# Patient Record
Sex: Male | Born: 1975 | Race: White | Hispanic: No | Marital: Married | Smoking: Former smoker
Health system: Southern US, Community
[De-identification: ages and names within clinical notes are randomized; demographics above are authoritative.]

## PROBLEM LIST (undated history)

## (undated) DIAGNOSIS — M779 Enthesopathy, unspecified: Secondary | ICD-10-CM

## (undated) DIAGNOSIS — E669 Obesity, unspecified: Secondary | ICD-10-CM

## (undated) DIAGNOSIS — E119 Type 2 diabetes mellitus without complications: Secondary | ICD-10-CM

## (undated) DIAGNOSIS — R7989 Other specified abnormal findings of blood chemistry: Secondary | ICD-10-CM

## (undated) DIAGNOSIS — M1990 Unspecified osteoarthritis, unspecified site: Secondary | ICD-10-CM

## (undated) DIAGNOSIS — I10 Essential (primary) hypertension: Secondary | ICD-10-CM

## (undated) DIAGNOSIS — K219 Gastro-esophageal reflux disease without esophagitis: Secondary | ICD-10-CM

## (undated) DIAGNOSIS — G4730 Sleep apnea, unspecified: Secondary | ICD-10-CM

## (undated) DIAGNOSIS — K760 Fatty (change of) liver, not elsewhere classified: Secondary | ICD-10-CM

## (undated) DIAGNOSIS — J45909 Unspecified asthma, uncomplicated: Secondary | ICD-10-CM

## (undated) HISTORY — PX: TONSILLECTOMY: SUR1361

## (undated) HISTORY — DX: Type 2 diabetes mellitus without complications: E11.9

## (undated) HISTORY — DX: Unspecified osteoarthritis, unspecified site: M19.90

## (undated) HISTORY — DX: Obesity, unspecified: E66.9

## (undated) HISTORY — DX: Fatty (change of) liver, not elsewhere classified: K76.0

## (undated) HISTORY — PX: WISDOM TOOTH EXTRACTION: SHX21

## (undated) HISTORY — DX: Unspecified asthma, uncomplicated: J45.909

## (undated) HISTORY — DX: Gastro-esophageal reflux disease without esophagitis: K21.9

## (undated) HISTORY — DX: Other specified abnormal findings of blood chemistry: R79.89

## (undated) HISTORY — PX: CARPAL TUNNEL RELEASE: SHX101

## (undated) HISTORY — DX: Essential (primary) hypertension: I10

## (undated) HISTORY — DX: Sleep apnea, unspecified: G47.30

---

## 2001-06-08 ENCOUNTER — Emergency Department (HOSPITAL_COMMUNITY): Admission: EM | Admit: 2001-06-08 | Discharge: 2001-06-08 | Payer: Self-pay | Admitting: *Deleted

## 2002-05-03 ENCOUNTER — Encounter: Payer: Self-pay | Admitting: Emergency Medicine

## 2002-05-03 ENCOUNTER — Emergency Department (HOSPITAL_COMMUNITY): Admission: EM | Admit: 2002-05-03 | Discharge: 2002-05-03 | Payer: Self-pay | Admitting: Emergency Medicine

## 2003-04-06 ENCOUNTER — Ambulatory Visit (HOSPITAL_COMMUNITY): Admission: RE | Admit: 2003-04-06 | Discharge: 2003-04-06 | Payer: Self-pay | Admitting: *Deleted

## 2006-03-18 ENCOUNTER — Encounter: Admission: RE | Admit: 2006-03-18 | Discharge: 2006-03-18 | Payer: Self-pay | Admitting: Orthopaedic Surgery

## 2006-03-26 ENCOUNTER — Ambulatory Visit (HOSPITAL_COMMUNITY): Admission: RE | Admit: 2006-03-26 | Discharge: 2006-03-26 | Payer: Self-pay | Admitting: Family Medicine

## 2011-04-01 ENCOUNTER — Ambulatory Visit (HOSPITAL_COMMUNITY)
Admission: RE | Admit: 2011-04-01 | Discharge: 2011-04-01 | Disposition: A | Discharge status: Home / Self Care | Payer: BC Managed Care – PPO | Source: Ambulatory Visit | Attending: Family Medicine | Admitting: Family Medicine

## 2011-04-01 ENCOUNTER — Other Ambulatory Visit (HOSPITAL_COMMUNITY): Payer: Self-pay | Admitting: Family Medicine

## 2011-04-01 DIAGNOSIS — R0989 Other specified symptoms and signs involving the circulatory and respiratory systems: Secondary | ICD-10-CM

## 2011-04-01 DIAGNOSIS — R0602 Shortness of breath: Secondary | ICD-10-CM | POA: Insufficient documentation

## 2011-04-01 DIAGNOSIS — R0609 Other forms of dyspnea: Secondary | ICD-10-CM

## 2015-12-31 ENCOUNTER — Ambulatory Visit (INDEPENDENT_AMBULATORY_CARE_PROVIDER_SITE_OTHER): Payer: BC Managed Care – PPO

## 2015-12-31 ENCOUNTER — Other Ambulatory Visit: Payer: Self-pay | Admitting: Neurosurgery

## 2015-12-31 DIAGNOSIS — M4302 Spondylolysis, cervical region: Secondary | ICD-10-CM

## 2015-12-31 DIAGNOSIS — M542 Cervicalgia: Secondary | ICD-10-CM

## 2016-01-10 ENCOUNTER — Other Ambulatory Visit: Payer: Self-pay | Admitting: Neurosurgery

## 2016-01-10 DIAGNOSIS — Z77018 Contact with and (suspected) exposure to other hazardous metals: Secondary | ICD-10-CM

## 2016-01-10 DIAGNOSIS — M542 Cervicalgia: Secondary | ICD-10-CM

## 2016-01-16 ENCOUNTER — Ambulatory Visit
Admission: RE | Admit: 2016-01-16 | Discharge: 2016-01-16 | Disposition: A | Discharge status: Home / Self Care | Payer: BC Managed Care – PPO | Source: Ambulatory Visit | Attending: Neurosurgery | Admitting: Neurosurgery

## 2016-01-16 DIAGNOSIS — Z77018 Contact with and (suspected) exposure to other hazardous metals: Secondary | ICD-10-CM

## 2016-01-21 ENCOUNTER — Ambulatory Visit
Admission: RE | Admit: 2016-01-21 | Discharge: 2016-01-21 | Disposition: A | Discharge status: Home / Self Care | Payer: BC Managed Care – PPO | Source: Ambulatory Visit | Attending: Neurosurgery | Admitting: Neurosurgery

## 2016-01-21 DIAGNOSIS — M542 Cervicalgia: Secondary | ICD-10-CM

## 2016-02-03 ENCOUNTER — Ambulatory Visit (HOSPITAL_COMMUNITY): Payer: BC Managed Care – PPO | Attending: Neurosurgery

## 2016-02-03 DIAGNOSIS — G540 Brachial plexus disorders: Secondary | ICD-10-CM

## 2016-02-03 DIAGNOSIS — M542 Cervicalgia: Secondary | ICD-10-CM | POA: Diagnosis not present

## 2016-02-03 DIAGNOSIS — M62838 Other muscle spasm: Secondary | ICD-10-CM | POA: Diagnosis present

## 2016-02-03 NOTE — Therapy (Signed)
[place], [place], [address] Phone: [phone]   Fax:  [phone]  Physical Therapy Evaluation  Patient Details  Name: Samuel Campbell MRN: [id number] Date of Birth: [birth year] Referring Provider: Ashok Pall   Encounter Date: 02/03/2016      PT End of Session - 02/03/16 1438    Visit Number 1   Number of Visits 12   Date for PT Re-Evaluation 03/04/16   Authorization Type [place] Time Period 02/03/16-04/04/16   PT Start Time 1302   PT Stop Time 1402   PT Time Calculation (min) 60 min   Activity Tolerance Patient tolerated treatment well;Patient limited by pain   Behavior During Therapy [facility] for tasks assessed/performed      No past medical history on file.  No past surgical history on file.  There were no vitals filed for this visit.       Subjective Assessment - 02/03/16 1303    Subjective Pt reports continued pain in neck/arm since fall down steps. His lower back pain has resolved well after course of prednisone, etc.    Pertinent History Sustained a fall onto his buttocks down ~8 steps back in early september 2017. Immediately experienced acute onset of acute neck pain, and exacerbation of chornic back pain. Pt reports episode of neck pain with mobility deficits 5Ya that responded well/quickly to cortisone shot/medical management.    How long can you sit comfortably? Not consistently limited   How long can you stand comfortably? Limited to 45-60 minutes during periods of exacerbation.    Diagnostic tests MRI: no significant findings per patient.    Patient Stated Goals resolve pain, improve tolerance to function.    Currently in Pain? 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 1   Has the patient had a decrease in activity level because of a fear of falling?  Yes   Is the patient reluctant to leave their home because of a fear of falling?  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, inection bilat;     Posture/Postural Control   Posture/Postural Control Postural limitations   Postural  Limitations Rounded Shoulders;Flexed trunk;Increased thoracic kyphosis  +cervical lordosis, subbocciptal/pec minor tightness     ROM / Strength   AROM / PROM / Strength AROM;Strength     AROM   AROM Assessment Site Cervical  all assessed to gravity, seated due to postural limits   Cervical Flexion 45   Cervical Extension 31   Cervical - Right Side Bend 36   Cervical - Left Side Bend 37   Cervical - Right Rotation 58   Cervical - Left Rotation 65     Strength   Strength Assessment Site Hand;Elbow;Shoulder   Right/Left Shoulder Right;Left   Right Shoulder Flexion 4/5  serratus 4+/5   Right Shoulder Extension 5/5  prone, shoulder 90* abduction   Right  Shoulder ABduction 5/5   Right Shoulder Internal Rotation 5/5   Right Shoulder External Rotation 4+/5   Right Shoulder Horizontal ABduction 4-/5  (middle Trap); Scapular elevation: 5/5   Right Shoulder Horizontal ADduction --  Posterior deltoid: 5/5   Left Shoulder Flexion 4/5  serratus 4+/5   Left Shoulder Extension 4+/5  prone, shoulder 90* abduction   Left Shoulder ABduction 4+/5   Left Shoulder Internal Rotation 4+/5   Left Shoulder External Rotation 4/5   Left Shoulder Horizontal ABduction --  4-/5 (middle Trap); Scapular elevation: 5/5   Left Shoulder Horizontal ADduction --  Posterior deltoid: 4+/5   Right/Left Elbow Right;Left   Right Elbow Flexion 5/5   Right Elbow Extension 4+/5   Left Elbow Flexion 4+/5   Left Elbow Extension 4/5   Right/Left hand Right;Left   Right Hand Grip (lbs) 115   Left Hand Grip (lbs) 120     Palpation   Spinal mobility Rigid thoracic kyphosis    Palpation comment Painful upper trap, subboccipitals, 1st rib, scalenes Bilat   Painful Left Pec Minor.      Special Tests    Special Tests Cervical   Cervical Tests Spurling's     Spurling's   Findings Negative   Side Left   Comment A and B test bilat, both negative                   [facility] Adult PT Treatment/Exercise - 02/03/16 0001      Exercises   Exercises Shoulder     Shoulder Exercises: Seated   Elevation AROM;15 reps;Other (comment);Both;Strengthening  15x3secH (HEP educaiton)    Retraction AROM;Both;15 reps;Other (comment);Strengthening  15x5secH (HEP educaiton)      Shoulder Exercises: Psychologist, sport and exercise 1 rep;Other (comment)  2 minutes: HEP Education   Other Shoulder Stretches Supine Towel Roll Stretch: Ts 1x3 minutes   HEP education                 PT Education - 02/03/16 1437    Education provided Yes   Education Details explained upper crossed syndrome, and thoracic outlet syndrome, demonstrated scapular weakness, postural limitations,  and tightness in pec minor.    Person(s) Educated Patient   Methods Explanation;Demonstration;Tactile cues   Comprehension Verbalized understanding;Need further instruction          PT Short Term Goals - 02/03/16 1452      PT SHORT TERM GOAL #1   Title After 4 weeks patient will demonstrate 5/5 strength in BUE, except for prone post deltoid/middle trap, which will be 4+/5.   Status New     PT SHORT TERM GOAL #2   Title After 4 weeks pt will demonstrate improved cervical rotation >70 degrees bilat.  Status New     PT SHORT TERM GOAL #3   Title After 4 weeks pt will demonstrate independence and strong compliance with starter HEP.    Status New           PT Long Term Goals - 02/03/16 1456      PT LONG TERM GOAL #1   Title After 8 weeks patient will demonstrate 5/5 strength in BUE, including  prone post deltoid/middle trap.    Status New     PT LONG TERM GOAL #2   Title After 8 weeks pt will demonstrate improved cervical rotation >78 degrees bilat.    Status New     PT LONG TERM GOAL #3   Title After 8 weeks pt will demonstrate independence and strong compliance with advanced HEP to further progress toward postural deficits going foward s/p DC.    Status New               Plan - 02/03/16 1440    Clinical Impression Statement Pt demonstrating strong postural limitations of the scapulothoracic joint, as well as the thoracic spine, and weakness of the scapular muscles bilat, with great demonstrate scapular anterior tilt+ depression on the left side.  The aforementioned are thought to be contributory to bilat neck pain, as well as proximal LUe paresthesia. Bilat hand paresthesia more consistent with chronic CTS, which has been treated successfully in the past. Examination of the cervical spine reveals low probablility of true radicular signs/symptoms, however preseentation is suspicious for left sided brachial plexopathy, with associated pain in bilat upper traps, all of  which are easily aggravated by postural changes and position modification. Rotational deficts are more typical of upper crossed syndrome related adaptive shortening in the suboccipital group.    Rehab Potential Good   Clinical Impairments Affecting Rehab Potential Pt's busy schedule will make scheduling difficult.    PT Frequency 2x / week   PT Duration 4 weeks   PT Treatment/Interventions ADLs/Self Care Home Management;Therapeutic activities;Therapeutic exercise;Dry needling;Passive range of motion;Patient/family education;Neuromuscular re-education;Electrical Stimulation;Moist Heat   PT Next Visit Plan Review goals, repeat HEP in full, pec minor release; Row, scapular Ts with band, scapular Is with band, suboccipital release, capital rotation stretching, rotator cuff strengthening.    PT Home Exercise Plan At eval: supine pec strtetch towel between scapulae, stargazer stretch towel between scapulae, Shrugs 15x3sH, scap retraction 15x3sec hold.    Consulted and Agree with Plan of Care Patient      Patient will benefit from skilled therapeutic intervention in order to improve the following deficits and impairments:  Decreased activity tolerance, Decreased mobility, Decreased range of motion, Obesity, Pain, Improper body mechanics, Postural dysfunction, Impaired sensation, Decreased strength, Hypomobility, Decreased endurance, Increased muscle spasms  Visit Diagnosis: Cervicalgia - Plan: PT plan of care cert/re-cert  Brachial plexus disorders - Plan: PT plan of care cert/re-cert  Other muscle spasm - Plan: PT plan of care cert/re-cert     Problem List There are no active problems to display for this patient.   3:00 PM, 02/03/16 Etta Grandchild, PT, DPT Physical Therapist at [facility] [phone] (office)     [place] [address] [place], [place], [address] Phone: [phone]   Fax:   [phone]  Name: Samuel Campbell MRN: [id number] Date of Birth: 1975-04-17

## 2016-02-05 ENCOUNTER — Telehealth (HOSPITAL_COMMUNITY): Payer: Self-pay | Admitting: Family Medicine

## 2016-02-05 NOTE — Telephone Encounter (Signed)
02/05/16 called to offer him an appt with Samuel Campbell at 5:30 tomorrow but he can't come because he will be working at his part time job.

## 2016-02-12 ENCOUNTER — Ambulatory Visit (HOSPITAL_COMMUNITY): Payer: BC Managed Care – PPO | Attending: Neurosurgery | Admitting: Physical Therapy

## 2016-02-12 DIAGNOSIS — M542 Cervicalgia: Secondary | ICD-10-CM | POA: Diagnosis not present

## 2016-02-12 DIAGNOSIS — M62838 Other muscle spasm: Secondary | ICD-10-CM | POA: Insufficient documentation

## 2016-02-12 DIAGNOSIS — G540 Brachial plexus disorders: Secondary | ICD-10-CM | POA: Insufficient documentation

## 2016-02-12 NOTE — Therapy (Signed)
[place], [place], [address] Phone: [phone]   Fax:  [phone]  Physical Therapy Treatment  Patient Details  Name: Samuel Campbell MRN: [id number] Date of Birth: Mar 03, 1976 Referring Provider: Ashok Pall   Encounter Date: 02/12/2016      PT End of Session - 02/12/16 1727    Visit Number 2   Number of Visits 12   Date for PT Re-Evaluation 03/04/16   Authorization Type [place] Time Period 02/03/16-04/04/16   PT Start Time 1645   PT Stop Time 1724   PT Time Calculation (min) 39 min   Activity Tolerance Patient tolerated treatment well;Patient limited by pain;No increased pain   Behavior During Therapy [facility] for tasks assessed/performed      No past medical history on file.  No past surgical history on file.  There were no vitals filed for this visit.      Subjective Assessment - 02/12/16 1646    Subjective Pt reports things are going well. He states that his neck and arm are bothering him but he is able to deal with it. He thinks it is improving some. He is only able to do his exercises randomly throughout the day. He thinks he might get to do them 1-2x a day but isn't sure.    Pertinent History Sustained a fall onto his buttocks down ~8 steps back in early september 2017. Immediately experienced acute onset of acute neck pain, and exacerbation of chornic back pain. Pt reports episode of neck pain with mobility deficits 5Ya that responded well/quickly to cortisone shot/medical management.    How long can you sit comfortably? Not consistently limited   How long can you stand comfortably? Limited to 45-60 minutes during periods of exacerbation.    Diagnostic tests MRI: no significant findings per patient.    Patient Stated Goals resolve pain, improve tolerance to function.    Currently in Pain? Other (Comment)  No rating currently   Pain Onset More than a month ago  Early  September 2017                         [facility] Adult PT Treatment/Exercise - 02/12/16 0001      Shoulder Exercises: Supine   Other Supine Exercises capital flexion 15x3 sec hold      Shoulder Exercises: Seated   Retraction AROM;Both;15 reps;Other (comment)  5 sec hold    Other Seated Exercises thoracic extension x5 reps each from low thoracic to upper thoracic region.      Shoulder Exercises: Prone   Extension Left;10 reps;Other (comment)  palm down   Horizontal ABduction 1 Left;10 reps;AROM   Horizontal ABduction 1 Limitations palm down and palm up   Horizontal ABduction 2 Left;10 reps   Horizontal ABduction 2 Limitations scapular plane: palm down and palm up     Shoulder Exercises: Standing   Row Both;15 reps   Theraband Level (Shoulder Row) Level 3 (Green)   Row Limitations x2 sets    Other Standing Exercises T's, green TB 2x10 reps      Manual Therapy   Manual Therapy Myofascial release   Manual therapy comments separate rest of session   Myofascial Release sub occipital release, Lt upper trap/levator TrP release                 PT Education - 02/12/16 1727    Education provided Yes  Education Details reviewed goals and HEP   Methods Explanation;Demonstration   Comprehension Verbalized understanding;Returned demonstration          PT Short Term Goals - 02/03/16 1452      PT SHORT TERM GOAL #1   Title After 4 weeks patient will demonstrate 5/5 strength in BUE, except for prone post deltoid/middle trap, which will be 4+/5.   Status New     PT SHORT TERM GOAL #2   Title After 4 weeks pt will demonstrate improved cervical rotation >70 degrees bilat.    Status New     PT SHORT TERM GOAL #3   Title After 4 weeks pt will demonstrate independence and strong compliance with starter HEP.    Status New           PT Long Term Goals - 02/03/16 1456      PT LONG TERM GOAL #1   Title After 8 weeks patient will demonstrate 5/5 strength in  BUE, including  prone post deltoid/middle trap.    Status New     PT LONG TERM GOAL #2   Title After 8 weeks pt will demonstrate improved cervical rotation >78 degrees bilat.    Status New     PT LONG TERM GOAL #3   Title After 8 weeks pt will demonstrate independence and strong compliance with advanced HEP to further progress toward postural deficits going foward s/p DC.    Status New               Plan - 02/12/16 1731    Clinical Impression Statement Today was the pt's first treatment following his evaluation 1 week ago. Session focused on review of HEP and goals as well as introduction of therex to improve rotator cuff strength, thoracic mobility and improve flexibility of sub occipital musculature to improve posture and pain in his neck/UE. Pt able to perform exercises with occasional cues from therapist to decrease upper trap over activation. Ended session with no increase in pain. Will continue with current POC.   Rehab Potential Good   Clinical Impairments Affecting Rehab Potential Pt's busy schedule will make scheduling difficult.    PT Frequency 2x / week   PT Duration 4 weeks   PT Treatment/Interventions ADLs/Self Care Home Management;Therapeutic activities;Therapeutic exercise;Dry needling;Passive range of motion;Patient/family education;Neuromuscular re-education;Electrical Stimulation;Moist Heat   PT Next Visit Plan pec minor release; rotation stretch, blackburn 6; Row, scapular Ts with band, scapular Is with band, suboccipital release, capital rotation stretching, rotator cuff strengthening.    PT Home Exercise Plan At eval: supine pec strtetch towel between scapulae, stargazer stretch towel between scapulae, Shrugs 15x3sH, scap retraction 15x3sec hold.    Consulted and Agree with Plan of Care Patient      Patient will benefit from skilled therapeutic intervention in order to improve the following deficits and impairments:  Decreased activity tolerance, Decreased  mobility, Decreased range of motion, Obesity, Pain, Improper body mechanics, Postural dysfunction, Impaired sensation, Decreased strength, Hypomobility, Decreased endurance, Increased muscle spasms  Visit Diagnosis: Cervicalgia  Brachial plexus disorders  Other muscle spasm     Problem List There are no active problems to display for this patient.   5:36 PM,02/12/16 Elly Modena PT, DPT [facility] [place] [address] [place], [place], [address] Phone: [phone]   Fax:  [phone]  Name: Samuel Campbell MRN: [id number] Date of Birth: [birth year]

## 2016-02-14 ENCOUNTER — Ambulatory Visit (HOSPITAL_COMMUNITY): Payer: BC Managed Care – PPO | Admitting: Physical Therapy

## 2016-02-17 ENCOUNTER — Ambulatory Visit (HOSPITAL_COMMUNITY): Payer: BC Managed Care – PPO | Admitting: Physical Therapy

## 2016-02-17 DIAGNOSIS — M542 Cervicalgia: Secondary | ICD-10-CM | POA: Diagnosis not present

## 2016-02-17 DIAGNOSIS — M62838 Other muscle spasm: Secondary | ICD-10-CM

## 2016-02-17 DIAGNOSIS — G540 Brachial plexus disorders: Secondary | ICD-10-CM

## 2016-02-17 NOTE — Therapy (Signed)
[place], [place], [address] Phone: [phone]   Fax:  [phone]  Physical Therapy Treatment  Patient Details  Name: Samuel Campbell MRN: [id number] Date of Birth: November 09, 1975 Referring Provider: Ashok Pall   Encounter Date: 02/17/2016      PT End of Session - 02/17/16 1731    Visit Number 3   Number of Visits 12   Date for PT Re-Evaluation 03/04/16   Authorization Type [place] Time Period 02/03/16-04/04/16   PT Start Time 1648   PT Stop Time 1726   PT Time Calculation (min) 38 min   Activity Tolerance Patient tolerated treatment well;Patient limited by pain;No increased pain   Behavior During Therapy [facility] for tasks assessed/performed      No past medical history on file.  No past surgical history on file.  There were no vitals filed for this visit.      Subjective Assessment - 02/17/16 1646    Subjective Pt states he is overall better.  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  Can now move his neck fully    Currently in Pain? No/denies                         [facility] Adult PT Treatment/Exercise - 02/17/16 0001      Shoulder Exercises: Seated   Horizontal ABduction 15 reps;Both   Horizontal ABduction Limitations seated jumping jacks, one set palms up one set palms forward     Shoulder Exercises: Prone   Retraction Both;15 reps   Extension Left;Other (comment);15 reps   Horizontal ABduction 1 Both;15 reps   Horizontal ABduction 1 Limitations palm down and palm up, one set of each   Horizontal ABduction 2 15 reps   Horizontal ABduction 2 Limitations scapular plane: palm down and palm up one set of each     Shoulder Exercises: Standing   Extension Theraband;15 reps   Row Both;15 reps   Retraction Both;15 reps   Other Standing Exercises  corner stretch 3X30"   Other Standing Exercises UE flexion against wall 10 reps                  PT Short Term Goals - 02/03/16 1452      PT SHORT TERM GOAL #1   Title After 4 weeks patient will demonstrate 5/5 strength in BUE, except for prone post deltoid/middle trap, which will be 4+/5.   Status New     PT SHORT TERM GOAL #2   Title After 4 weeks pt will demonstrate improved cervical rotation >70 degrees bilat.    Status New     PT SHORT TERM GOAL #3   Title After 4 weeks pt will demonstrate independence and strong compliance with starter HEP.    Status New           PT Long Term Goals - 02/03/16 1456      PT LONG TERM GOAL #1   Title After 8 weeks patient will demonstrate 5/5 strength in BUE, including  prone post deltoid/middle trap.    Status New     PT LONG TERM GOAL #2   Title After 8 weeks pt will demonstrate improved cervical rotation >78 degrees bilat.    Status New     PT LONG TERM GOAL #3  Title After 8 weeks pt will demonstrate independence and strong compliance with advanced HEP to further progress toward postural deficits going foward s/p DC.    Status New               Plan - 02/17/16 1731    Clinical Impression Statement Pt without pain and full ROM in cervical region without pain or radiating symptoms.  Progressed postural strengthening with addition of pectoral stretching.  Pt able to complete all therex without pain, however noted fatigue and perspiration during session.  No rest break needed, however.     Rehab Potential Good   Clinical Impairments Affecting Rehab Potential Pt's busy schedule will make scheduling difficult.    PT Frequency 2x / week   PT Duration 4 weeks   PT Treatment/Interventions ADLs/Self Care Home Management;Therapeutic activities;Therapeutic exercise;Dry needling;Passive range of motion;Patient/family education;Neuromuscular re-education;Electrical Stimulation;Moist Heat   PT Next Visit Plan Continue with  focus towards improving posture and overall function.   PT Home Exercise Plan At eval: supine pec strtetch towel between scapulae, stargazer stretch towel between scapulae, Shrugs 15x3sH, scap retraction 15x3sec hold.    Consulted and Agree with Plan of Care Patient      Patient will benefit from skilled therapeutic intervention in order to improve the following deficits and impairments:  Decreased activity tolerance, Decreased mobility, Decreased range of motion, Obesity, Pain, Improper body mechanics, Postural dysfunction, Impaired sensation, Decreased strength, Hypomobility, Decreased endurance, Increased muscle spasms  Visit Diagnosis: Cervicalgia  Other muscle spasm  Brachial plexus disorders     Problem List There are no active problems to display for this patient.   Teena Irani, PTA/CLT [phone]  02/17/2016, 5:35 PM  [place] [address] [place], [place], [address] Phone: [phone]   Fax:  [phone]  Name: YOON CLEM MRN: [id number] Date of Birth: 04/24/1975

## 2016-02-19 ENCOUNTER — Ambulatory Visit (HOSPITAL_COMMUNITY): Payer: BC Managed Care – PPO

## 2016-02-19 DIAGNOSIS — M542 Cervicalgia: Secondary | ICD-10-CM | POA: Diagnosis not present

## 2016-02-19 DIAGNOSIS — M62838 Other muscle spasm: Secondary | ICD-10-CM

## 2016-02-19 DIAGNOSIS — G540 Brachial plexus disorders: Secondary | ICD-10-CM

## 2016-02-19 NOTE — Therapy (Signed)
[place], [place], [address] Phone: [phone]   Fax:  [phone]  Physical Therapy Treatment (Discharge)  Patient Details  Name: Samuel Campbell MRN: [id number] Date of Birth: [birth year] Referring Provider: Ashok Pall   Encounter Date: 02/19/2016      PT End of Session - 02/19/16 1718    Visit Number 5   Number of Visits 12   Date for PT Re-Evaluation 03/04/16   Authorization Type [place] Time Period 02/03/16-04/04/16   PT Start Time 1650   PT Stop Time 1710   PT Time Calculation (min) 20 min   Activity Tolerance Patient tolerated treatment well   Behavior During Therapy [facility] for tasks assessed/performed      No past medical history on file.  No past surgical history on file.  There were no vitals filed for this visit.      Subjective Assessment - 02/19/16 1653    Subjective Pt stated he is feeling good overall, reports he feels ready for discharge with ability to move his neck fully   Pertinent History Sustained a fall onto his buttocks down ~8 steps back in early september 2017. Immediately experienced acute onset of acute neck pain, and exacerbation of chornic back pain. Pt reports episode of neck pain with mobility deficits 5Ya that responded well/quickly to cortisone shot/medical management.    How long can you sit comfortably? Not consistently limited   How long can you stand comfortably? Able to stand comfortably for a couple of hours and requires sitting due to fatigue rather than pain.  (Limited to 45-60 minutes during periods of exacerbation.)   Patient Stated Goals resolve pain, improve tolerance to function.    Currently in Pain? No/denies            [facility] PT Assessment - 02/19/16 0001      AROM   Cervical Flexion 50   Cervical Extension 35   Cervical - Right Side Bend 38   Cervical - Left Side Bend 38   Cervical - Right Rotation 85   Cervical -  Left Rotation 86     Strength   Right Shoulder Flexion 4+/5   Right Shoulder ABduction 5/5   Right Shoulder External Rotation 5/5   Right Shoulder Horizontal ABduction 5/5   Left Shoulder Flexion 5/5   Left Shoulder ABduction 5/5   Left Shoulder Internal Rotation 5/5   Left Shoulder External Rotation 5/5   Right Elbow Flexion 5/5   Right Elbow Extension 5/5   Left Elbow Flexion 5/5   Left Elbow Extension 5/5                             PT Education - 02/19/16 1718    Education provided Yes   Education Details HEP update including therband exercises and postural training; progress with skilled PT services, DC tdoay    Person(s) Educated Patient   Methods Explanation;Demonstration   Comprehension Verbalized understanding;Returned demonstration;Need further instruction          PT Short Term Goals - 02/19/16 1709      PT SHORT TERM GOAL #1   Title After 4 weeks patient will demonstrate 5/5 strength in BUE, except for prone post deltoid/middle trap, which will be 4+/5.   Status Achieved     PT SHORT TERM GOAL #2   Title After 4 weeks pt will demonstrate improved cervical  rotation >70 degrees bilat.    Status Achieved     PT SHORT TERM GOAL #3   Title After 4 weeks pt will demonstrate independence and strong compliance with starter HEP.    Status Achieved           PT Long Term Goals - 02/19/16 1709      PT LONG TERM GOAL #1   Title After 8 weeks patient will demonstrate 5/5 strength in BUE, including  prone post deltoid/middle trap.    Status Achieved     PT LONG TERM GOAL #2   Title After 8 weeks pt will demonstrate improved cervical rotation >78 degrees bilat.    Status Achieved     PT LONG TERM GOAL #3   Title After 8 weeks pt will demonstrate independence and strong compliance with advanced HEP to further progress toward postural deficits going foward s/p DC.    Status Achieved               Plan - 02/19/16 1721    Clinical  Impression Statement Re-assessment performed today as PTA brought to DPT's attention that patient reports he is ready for discharge. PTA performed process of education patient on appropriate HEP updates, and DPT performed re-assessment portion with no functional deficits noted and no concerns from patient. Patient has met all goals and shows great progress with skilled PT services. DC today.    Rehab Potential Good   Clinical Impairments Affecting Rehab Potential Pt's busy schedule will make scheduling difficult.    PT Next Visit Plan DC today    PT Home Exercise Plan At eval: supine pec strtetch towel between scapulae, stargazer stretch towel between scapulae, Shrugs 15x3sH, scap retraction 15x3sec hold.    Consulted and Agree with Plan of Care Patient      Patient will benefit from skilled therapeutic intervention in order to improve the following deficits and impairments:  Decreased activity tolerance, Decreased mobility, Decreased range of motion, Obesity, Pain, Improper body mechanics, Postural dysfunction, Impaired sensation, Decreased strength, Hypomobility, Decreased endurance, Increased muscle spasms  Visit Diagnosis: Cervicalgia  Other muscle spasm  Brachial plexus disorders     Problem List There are no active problems to display for this patient.  PHYSICAL THERAPY DISCHARGE SUMMARY  Visits from Start of Care: 5  Current functional level related to goals / functional outcomes: Patient reports he has no functional deficits, skilled assessment is consistent with this report. DC today.    Remaining deficits: None    Education / Equipment: HEP updates, DC today  Plan: Patient agrees to discharge.  Patient goals were met. Patient is being discharged due to meeting the stated rehab goals.  ?????      Deniece Ree PT, DPT [place] [address] [place], [place], [address] Phone: [phone]   Fax:   [phone]  Name: Samuel Campbell MRN: [id number] Date of Birth: [birth year]

## 2016-02-21 ENCOUNTER — Encounter (HOSPITAL_COMMUNITY): Payer: BC Managed Care – PPO

## 2016-02-26 ENCOUNTER — Ambulatory Visit (HOSPITAL_COMMUNITY): Payer: BC Managed Care – PPO | Admitting: Physical Therapy

## 2017-06-15 ENCOUNTER — Ambulatory Visit (INDEPENDENT_AMBULATORY_CARE_PROVIDER_SITE_OTHER): Payer: BC Managed Care – PPO

## 2017-06-15 ENCOUNTER — Ambulatory Visit: Payer: BC Managed Care – PPO | Admitting: Podiatry

## 2017-06-15 ENCOUNTER — Encounter: Payer: Self-pay | Admitting: Podiatry

## 2017-06-15 ENCOUNTER — Other Ambulatory Visit: Payer: Self-pay | Admitting: Podiatry

## 2017-06-15 VITALS — BP 122/77 | HR 63 | Resp 16

## 2017-06-15 DIAGNOSIS — M722 Plantar fascial fibromatosis: Secondary | ICD-10-CM

## 2017-06-15 MED ORDER — METHYLPREDNISOLONE 4 MG PO TBPK: ORAL_TABLET | ORAL | 0 refills | Status: DC

## 2017-06-15 MED ORDER — MELOXICAM 15 MG PO TABS: 15.0000 mg | ORAL_TABLET | Freq: Every day | ORAL | 3 refills | Status: DC

## 2017-06-15 NOTE — Patient Instructions (Signed)

## 2017-06-15 NOTE — Progress Notes (Signed)
Subjective:  Patient ID: Samuel Campbell, male    DOB: 09-08-1975,  MRN: [id number] HPI Chief Complaint  Patient presents with  . Foot Pain    Plantar heel and medial foot and ankle bilateral (L>R) - aching x 2-3 weeks, AM pain and constant through day, tried ice, Ibuprofen and ankle brace-temp relief  . Diabetes    Last a1c was 5.3   . New Patient (Initial Visit)    42 y.o. male presents with the above complaint.   ROS: Denies fever chills nausea vomiting muscle aches pains shortness of breath calf pain.  Past Medical History:  Diagnosis Date  . Diabetes mellitus without complication ([place])      Current Outpatient Medications:  .  metFORMIN (GLUCOPHAGE) 500 MG tablet, Take by mouth 2 (two) times daily with a meal., Disp: , Rfl:  .  fluticasone (FLONASE) 50 MCG/ACT nasal spray, , Disp: , Rfl:  .  hydrochlorothiazide (HYDRODIURIL) 25 MG tablet, , Disp: , Rfl:  .  levocetirizine (XYZAL) 5 MG tablet, , Disp: , Rfl:  .  losartan (COZAAR) 100 MG tablet, , Disp: , Rfl:  .  meloxicam (MOBIC) 15 MG tablet, Take 1 tablet (15 mg total) by mouth daily., Disp: 30 tablet, Rfl: 3 .  methylPREDNISolone (MEDROL DOSEPAK) 4 MG TBPK tablet, 6 day dose pack - take as directed, Disp: 21 tablet, Rfl: 0 .  montelukast (SINGULAIR) 10 MG tablet, , Disp: , Rfl:   No Known Allergies Review of Systems Objective:   Vitals:   06/15/17 0937  BP: 122/77  Pulse: 63  Resp: 16    General: Well developed, nourished, in no acute distress, alert and oriented x3   Dermatological: Skin is warm, dry and supple bilateral. Nails x 10 are well maintained; remaining integument appears unremarkable at this time. There are no open sores, no preulcerative lesions, no rash or signs of infection present.  Vascular: Dorsalis Pedis artery and Posterior Tibial artery pedal pulses are 2/4 bilateral with immedate capillary fill time. Pedal hair growth present. No varicosities and no lower extremity edema present bilateral.     Neruologic: Grossly intact via light touch bilateral. Vibratory intact via tuning fork bilateral. Protective threshold with Semmes Wienstein monofilament intact to all pedal sites bilateral. Patellar and Achilles deep tendon reflexes 2+ bilateral. No Babinski or clonus noted bilateral.   Musculoskeletal: No gross boney pedal deformities bilateral. No pain, crepitus, or limitation noted with foot and ankle range of motion bilateral. Muscular strength 5/5 in all groups tested bilateral.  Predominant finding is pain on palpation medial calcaneal tubercle of the left heel.  No pain on medial lateral compression of the calcaneus.  Gait: Unassisted, Nonantalgic.    Radiographs:  Radiographs taken today bilaterally demonstrate an osseously mature individual soft tissue increase in density at the plantar fascial calcaneal insertion site left greater than right.  Assessment & Plan:   Assessment: Plantar fasciitis left greater than right.    Plan: Discussed etiology pathology conservative versus surgical therapies.  At this point after sterile Betadine skin prep injected the left heel today with Kenalog and local anesthetic 20 mg of Kenalog 5 mg Marcaine was injected to the point of maximal tenderness of the left heel.  He tolerated procedure well without complications.  Him on a Medrol Dosepak to be followed by meloxicam.  Placed in a plantar fascial brace to be followed by a night splint.  I also had him casted for orthotics.  We will follow-up with him in a  few weeks.  He was given both oral and written home-going instruction for care and use stretching exercises.     Serria Sloma T. [place], [place]

## 2017-06-16 DIAGNOSIS — M722 Plantar fascial fibromatosis: Secondary | ICD-10-CM | POA: Diagnosis not present

## 2017-07-06 ENCOUNTER — Ambulatory Visit: Payer: BC Managed Care – PPO | Admitting: Orthotics

## 2017-07-06 DIAGNOSIS — M722 Plantar fascial fibromatosis: Secondary | ICD-10-CM

## 2017-07-06 NOTE — Progress Notes (Signed)
Patient came in today to pick up custom made foot orthotics.  The goals were accomplished and the patient reported no dissatisfaction with said orthotics.  Patient was advised of breakin period and how to report any issues. 

## 2017-07-27 ENCOUNTER — Other Ambulatory Visit: Payer: Self-pay

## 2017-07-27 ENCOUNTER — Encounter: Payer: Self-pay | Admitting: Podiatry

## 2017-07-27 ENCOUNTER — Ambulatory Visit: Payer: BC Managed Care – PPO | Admitting: Podiatry

## 2017-07-27 DIAGNOSIS — M722 Plantar fascial fibromatosis: Secondary | ICD-10-CM | POA: Diagnosis not present

## 2017-07-28 NOTE — Progress Notes (Signed)
He presents today for follow-up of his plantar fasciitis of his left foot.  States is doing much better he says the brace and the orthotics seem to really be helping a lot seems to be improving every day there is still has some tenderness and on occasion.  Says that he does not have tenderness every day but when he does have it it seems to be less intense and not as consistent.  Objective: Vital signs are stable alert and oriented x3 very little in the way of pain on palpation to the left heel.  Pulses are palpable no open lesions or wounds.  Assessment: Well-healing letter fasciitis left.  Plan: Continue conservative therapies until he is 100% improved.  Follow-up with me with any regression.

## 2017-08-17 ENCOUNTER — Encounter: Payer: Self-pay | Admitting: Podiatry

## 2017-08-17 ENCOUNTER — Ambulatory Visit: Payer: BC Managed Care – PPO | Admitting: Podiatry

## 2017-08-17 DIAGNOSIS — M76822 Posterior tibial tendinitis, left leg: Secondary | ICD-10-CM | POA: Diagnosis not present

## 2017-08-17 NOTE — Progress Notes (Signed)
He presents today for follow-up of plantar fasciitis states that seems to be doing about the same as it did last time is really not a lot of improvement.  Continues to wear his orthotics and states that the orthotics have been the biggest help so far.  Objective: Vital signs are stable he is alert and oriented x3 there is no erythema edema saline strange or odor he has no longer has pain on palpation of the plantar fascia of the left heel though he does have tenderness on palpation of the tibialis posterior tendon as it inserts on the navicular tuberosity left.  Assessment: Posterior tibial tendinitis insertional tendinitis with resolution of plantar fasciitis.  Plan: I injected the area today with 20 mg Kenalog 5 mg Marcaine to alleviate the symptoms.  He tolerated procedure well without complications follow-up with him in the near future.  I will follow-up with him in about a month when his wife comes in.

## 2017-09-23 ENCOUNTER — Telehealth: Payer: Self-pay | Admitting: *Deleted

## 2017-09-23 ENCOUNTER — Ambulatory Visit: Payer: BC Managed Care – PPO | Admitting: Podiatry

## 2017-09-23 ENCOUNTER — Encounter: Payer: Self-pay | Admitting: Podiatry

## 2017-09-23 DIAGNOSIS — M19079 Primary osteoarthritis, unspecified ankle and foot: Secondary | ICD-10-CM

## 2017-09-23 DIAGNOSIS — M722 Plantar fascial fibromatosis: Secondary | ICD-10-CM | POA: Diagnosis not present

## 2017-09-23 DIAGNOSIS — M76822 Posterior tibial tendinitis, left leg: Secondary | ICD-10-CM

## 2017-09-23 DIAGNOSIS — T148XXA Other injury of unspecified body region, initial encounter: Secondary | ICD-10-CM

## 2017-09-23 DIAGNOSIS — M779 Enthesopathy, unspecified: Secondary | ICD-10-CM

## 2017-09-23 NOTE — Telephone Encounter (Addendum)
Faxed request to cancel order for MRI of left foot, was unable to get pre-certed.

## 2017-09-23 NOTE — Telephone Encounter (Signed)
Orders to J. Quintana, RN for pre-cert and faxed to [facility]. 

## 2017-09-23 NOTE — Telephone Encounter (Signed)
-----   Message from Rip Harbour, [facility] sent at 09/23/2017  2:19 PM EDT ----- Regarding: MRI  MRI left foot and ankle - evaluate posterior tibial tendon tear vs tendonitis and midfoot arthritis left - surgical consideration

## 2017-09-25 NOTE — Progress Notes (Signed)
He presents today for follow-up of his posterior tibial tendinitis and plantar fasciitis of his left foot.  States that same fasciitis is any better the posterior tibial tendinitis seems to be no better if not worse.  States that really hurts right in here as he points to the medial longitudinal arch and the heel.  Objective: Vital signs are stable alert and oriented x3 still has severe pain on inversion against resistance and on palpation of the deep insertion sites of the posterior tibial tendon.  Pain on palpation medial calcaneal tubercle of the left heel.  Assessment: Failure to improve with conservative therapies posterior tibial tendon and plantar fasciitis left.  Plan: At this point I feel is necessary to obtain an MRI of the left rear foot to evaluate the posterior tibial tendon of the plantar fascia for possible surgical correction.

## 2017-09-27 NOTE — Telephone Encounter (Signed)
Orders for left ankle faxed to [facility] with note to include as much of the foot as possible.

## 2017-10-07 ENCOUNTER — Telehealth: Payer: Self-pay | Admitting: Podiatry

## 2017-10-07 NOTE — Telephone Encounter (Signed)
Pt called because he has not heard anything about MRI, I gave pt the number to [facility].

## 2017-10-14 ENCOUNTER — Telehealth: Payer: Self-pay | Admitting: *Deleted

## 2017-10-14 NOTE — Telephone Encounter (Signed)
I informed pt of Samuel Campbell [facility] statement.

## 2017-10-14 NOTE — Telephone Encounter (Signed)
Samuel Campbell [facility] states she will let the schedulers know the MRI left ankle needs to be scheduled.

## 2017-10-14 NOTE — Telephone Encounter (Signed)
Pt states he spoke to [place] and the said the MRI was cancelled.

## 2017-10-17 ENCOUNTER — Ambulatory Visit
Admission: RE | Admit: 2017-10-17 | Discharge: 2017-10-17 | Disposition: A | Discharge status: Home / Self Care | Payer: BC Managed Care – PPO | Source: Ambulatory Visit | Attending: Podiatry | Admitting: Podiatry

## 2017-10-19 ENCOUNTER — Telehealth: Payer: Self-pay

## 2017-10-19 NOTE — Telephone Encounter (Signed)
Mailed out copy of patient's MRI on CD to [facility] overread today 10/19/2017.

## 2017-10-19 NOTE — Telephone Encounter (Signed)
-----   Message from Garrel Ridgel, [place] sent at 10/19/2017  7:14 AM EDT ----- Sent for an over read and inform patient of the delay.

## 2017-10-19 NOTE — Telephone Encounter (Signed)
Left voicemail for patient to call regarding MRI results.

## 2017-10-25 ENCOUNTER — Ambulatory Visit (HOSPITAL_COMMUNITY)
Admission: RE | Admit: 2017-10-25 | Discharge: 2017-10-25 | Disposition: A | Discharge status: Home / Self Care | Payer: BC Managed Care – PPO | Source: Ambulatory Visit | Attending: Family Medicine | Admitting: Family Medicine

## 2017-10-25 ENCOUNTER — Other Ambulatory Visit (HOSPITAL_COMMUNITY): Payer: Self-pay | Admitting: Family Medicine

## 2017-10-25 DIAGNOSIS — Z1389 Encounter for screening for other disorder: Secondary | ICD-10-CM | POA: Diagnosis not present

## 2017-10-25 DIAGNOSIS — J069 Acute upper respiratory infection, unspecified: Secondary | ICD-10-CM | POA: Diagnosis not present

## 2017-11-05 ENCOUNTER — Encounter: Payer: Self-pay | Admitting: Podiatry

## 2017-11-23 ENCOUNTER — Ambulatory Visit: Payer: BC Managed Care – PPO | Admitting: Podiatry

## 2017-11-23 ENCOUNTER — Encounter: Payer: Self-pay | Admitting: Podiatry

## 2017-11-23 DIAGNOSIS — M76822 Posterior tibial tendinitis, left leg: Secondary | ICD-10-CM | POA: Diagnosis not present

## 2017-11-23 DIAGNOSIS — M722 Plantar fascial fibromatosis: Secondary | ICD-10-CM

## 2017-11-23 NOTE — Patient Instructions (Signed)
Pre-Operative Instructions  Congratulations, you have decided to take an important step towards improving your quality of life.  You can be assured that the doctors and staff at [facility] will be with you every step of the way.  Here are some important things you should know:  1. Plan to be at the surgery center/hospital at least 1 (one) hour prior to your scheduled time, unless otherwise directed by the surgical center/hospital staff.  You must have a responsible adult accompany you, remain during the surgery and drive you home.  Make sure you have directions to the surgical center/hospital to ensure you arrive on time. 2. If you are having surgery at [facility] or [facility] hospitals, you will need a copy of your medical history and physical form from your family physician within one month prior to the date of surgery. We will give you a form for your primary physician to complete.  3. We make every effort to accommodate the date you request for surgery.  However, there are times where surgery dates or times have to be moved.  We will contact you as soon as possible if a change in schedule is required.   4. No aspirin/ibuprofen for one week before surgery.  If you are on aspirin, any non-steroidal anti-inflammatory medications (Mobic, Aleve, Ibuprofen) should not be taken seven (7) days prior to your surgery.  You make take Tylenol for pain prior to surgery.  5. Medications - If you are taking daily heart and blood pressure medications, seizure, reflux, allergy, asthma, anxiety, pain or diabetes medications, make sure you notify the surgery center/hospital before the day of surgery so they can tell you which medications you should take or avoid the day of surgery. 6. No food or drink after midnight the night before surgery unless directed otherwise by surgical center/hospital staff. 7. No alcoholic beverages 24-hours prior to surgery.  No smoking 24-hours prior or 24-hours after  surgery. 8. Wear loose pants or shorts. They should be loose enough to fit over bandages, boots, and casts. 9. Don't wear slip-on shoes. Sneakers are preferred. 10. Bring your boot with you to the surgery center/hospital.  Also bring crutches or a walker if your physician has prescribed it for you.  If you do not have this equipment, it will be provided for you after surgery. 11. If you have not been contacted by the surgery center/hospital by the day before your surgery, call to confirm the date and time of your surgery. 12. Leave-time from work may vary depending on the type of surgery you have.  Appropriate arrangements should be made prior to surgery with your employer. 13. Prescriptions will be provided immediately following surgery by your doctor.  Fill these as soon as possible after surgery and take the medication as directed. Pain medications will not be refilled on weekends and must be approved by the doctor. 14. Remove nail polish on the operative foot and avoid getting pedicures prior to surgery. 15. Wash the night before surgery.  The night before surgery wash the foot and leg well with water and the antibacterial soap provided. Be sure to pay special attention to beneath the toenails and in between the toes.  Wash for at least three (3) minutes. Rinse thoroughly with water and dry well with a towel.  Perform this wash unless told not to do so by your physician.  Enclosed: 1 Ice pack (please put in freezer the night before surgery)   1 Hibiclens skin cleaner     Pre-op instructions  If you have any questions regarding the instructions, please do not hesitate to call our office.  [place]: [address], [place], [place] [address] -- [phone]  [place]: [address] [place], [place] [address] -- [phone]  [place]: [address]  [place], [place] [address] -- [phone]  [place]: [address], [address], [place], [place] [address] -- [phone]  Website:  [email or web address] 

## 2017-11-24 NOTE — Progress Notes (Signed)
He presents today for follow-up of his MRI.  He states that my foot is just killing me I cannot stand it any longer.  Something has to be done.  Objective: Vital signs are stable he is alert and oriented x3.  Pulses are palpable.  Neurologic sensorium is intact.  Degenerative flexors are intact.  Muscle strength was 5/5 dorsi flexion plantar flexors inverters everters onto the musculature is intact.  He has pain on inversion against resistance with pain on palpation of the navicular tuberosity as the posterior tibial tendon inserts on it.  He also has pain on palpation medial band of the plantar fascia.  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  Also demonstrates plantar fasciitis.  Assessment: Fasciitis and insertional tendinitis.  Plan: At this point we consented him for a Kidner procedure posterior tibial tendon advancement posterior tibial tendon repair and excision of the accessory navicular.  Also consented him for an endoscopic plantar fasciotomy as well as cast application.  He will be in a cast nonweightbearing after surgery.  He understands this is amenable to it.  We did discuss the possible postop complications which may include but not limited to postop pain bleeding swelling infection recurrence need further surgery overcorrection and under correction.  We provided him with both oral and written home-going instructions for the day of surgery including preop instructions information regarding the surgery center and anesthesia group.  I will follow-up with him in the near future for surgical intervention.  He will follow-up with [place] after his son's wedding in November.

## 2018-01-06 ENCOUNTER — Ambulatory Visit (INDEPENDENT_AMBULATORY_CARE_PROVIDER_SITE_OTHER): Payer: BC Managed Care – PPO | Admitting: Podiatry

## 2018-01-06 ENCOUNTER — Encounter: Payer: Self-pay | Admitting: Podiatry

## 2018-01-06 DIAGNOSIS — M79676 Pain in unspecified toe(s): Secondary | ICD-10-CM

## 2018-01-06 DIAGNOSIS — M722 Plantar fascial fibromatosis: Secondary | ICD-10-CM

## 2018-01-06 NOTE — Progress Notes (Signed)
Presents today chief complaint of pain to the left foot requesting injection to help relieve the pain prior to surgery.  Objective: Vital signs are stable alert oriented x3 has pain on palpation of the posterior tibial tendon at its insertion point.  There is fluctuant fluctuance within the tendon sheath.  He has pain on inversion against resistance.  Confirmed MRI tear.  Assessment: Posterior tibial tendon tendinitis.  Plan: At this point I injected 5 mg of Kenalog and 5 mg Marcaine point maximal tenderness.  Tolerated procedure well.  Follow-up with him in the near future for surgical intervention.  Remember to ask how the wedding went.

## 2018-02-10 ENCOUNTER — Other Ambulatory Visit: Payer: Self-pay | Admitting: Podiatry

## 2018-02-10 MED ORDER — CEPHALEXIN 500 MG PO CAPS: 500.0000 mg | ORAL_CAPSULE | Freq: Three times a day (TID) | ORAL | 0 refills | Status: DC

## 2018-02-10 MED ORDER — ONDANSETRON HCL 4 MG PO TABS: 4.0000 mg | ORAL_TABLET | Freq: Three times a day (TID) | ORAL | 0 refills | Status: DC | PRN

## 2018-02-10 MED ORDER — OXYCODONE-ACETAMINOPHEN 10-325 MG PO TABS: 1.0000 | ORAL_TABLET | Freq: Four times a day (QID) | ORAL | 0 refills | Status: AC | PRN

## 2018-02-11 ENCOUNTER — Encounter: Payer: Self-pay | Admitting: Podiatry

## 2018-02-11 DIAGNOSIS — M722 Plantar fascial fibromatosis: Secondary | ICD-10-CM | POA: Diagnosis not present

## 2018-02-11 DIAGNOSIS — M6702 Short Achilles tendon (acquired), left ankle: Secondary | ICD-10-CM | POA: Diagnosis not present

## 2018-02-11 HISTORY — PX: ANKLE SURGERY: SHX546

## 2018-02-11 HISTORY — PX: FOOT SURGERY: SHX648

## 2018-02-17 ENCOUNTER — Ambulatory Visit (INDEPENDENT_AMBULATORY_CARE_PROVIDER_SITE_OTHER): Payer: BC Managed Care – PPO | Admitting: Podiatry

## 2018-02-17 VITALS — Temp 98.6°F

## 2018-02-17 DIAGNOSIS — M722 Plantar fascial fibromatosis: Secondary | ICD-10-CM

## 2018-02-17 NOTE — Progress Notes (Signed)
He presents today 1 week status post Kidner procedure and endoscopic plantar fasciotomy.  Date of surgery February 11, 2018.  States that is doing well denies fever chills nausea vomiting muscle aches pains calf pain back pain chest pain shortness of breath.  Objective: Presents today dry sterile dressing and cast is intact nonambulatory utilizing a knee scooter.  Vital signs are stable he is alert and oriented x3 has good sensation to his toes and and toes are freely movable.  The cast is loose proximally but tight distally but does not shift or piston on the foot.  Radiographs demonstrate well-placed implant.  Assessment: Well-healing surgical foot.  Plan: Follow-up with [place] in 1 week for cast removal.  Another cast will be applied.

## 2018-02-24 ENCOUNTER — Ambulatory Visit (INDEPENDENT_AMBULATORY_CARE_PROVIDER_SITE_OTHER): Payer: BC Managed Care – PPO | Admitting: Podiatry

## 2018-02-24 DIAGNOSIS — M722 Plantar fascial fibromatosis: Secondary | ICD-10-CM | POA: Diagnosis not present

## 2018-02-24 DIAGNOSIS — Z9889 Other specified postprocedural states: Secondary | ICD-10-CM

## 2018-02-24 DIAGNOSIS — T148XXA Other injury of unspecified body region, initial encounter: Secondary | ICD-10-CM

## 2018-02-26 NOTE — Progress Notes (Signed)
He presents today date of surgery 02/11/2018 status post EPF and posterior tibial tendon repair.  He denies fever chills nausea body muscle aches pains calf pain back pain chest pain shortness of breath.  Not a lot of pain.  Objective: Vital signs are stable he is alert and oriented x3 cast intact was removed demonstrates minimal edema no erythema cellulitis drainage or odor staples are intact margins are well coapted is good inversion against resistance.  No signs of infection.  Assessment: Well-healing surgical foot.  Plan: Discussed etiology pathology and surgical therapies recasted him today we will follow-up with him 2 weeks for cast removal and staple removal.

## 2018-03-10 ENCOUNTER — Ambulatory Visit (INDEPENDENT_AMBULATORY_CARE_PROVIDER_SITE_OTHER): Payer: BC Managed Care – PPO | Admitting: Podiatry

## 2018-03-10 DIAGNOSIS — M76822 Posterior tibial tendinitis, left leg: Secondary | ICD-10-CM

## 2018-03-10 DIAGNOSIS — M722 Plantar fascial fibromatosis: Secondary | ICD-10-CM | POA: Diagnosis not present

## 2018-03-10 DIAGNOSIS — Z9889 Other specified postprocedural states: Secondary | ICD-10-CM

## 2018-03-12 NOTE — Progress Notes (Signed)
Subjective: Samuel Campbell is a 43 y.o. is seen today in office s/p EPF and posterior tibial tendon repair on 02/11/2018 by Dr. Milinda Pointer.  He has been in the cast and he has been feeling well and his pain is currently controlled. Denies any systemic complaints such as fevers, chills, nausea, vomiting. No calf pain, chest pain, shortness of breath.   Objective: General: No acute distress, AAOx3  DP/PT pulses palpable 2/4, CRT < 3 sec to all digits.  Protective sensation intact. Motor function intact.  LEFT foot: Incision is well coapted without any evidence of dehiscence and scars are formed.  Staples are intact without any evidence of dehiscence or signs of infection.  There is no surrounding erythema, ascending cellulitis, fluctuance, crepitus, malodor, drainage/purulence. There is minimal edema around the surgical site. There is no significant pain along the surgical site.  No other areas of tenderness to bilateral lower extremities.  No other open lesions or pre-ulcerative lesions.  No pain with calf compression, swelling, warmth, erythema.   Assessment and Plan:  Status post left foot surgery, doing well with no complications   -Treatment options discussed including all alternatives, risks, and complications -Staples removed without any complications after removal incision remained well coapted.  Today he was placed into a cam boot.  Continue nonweightbearing for now but I want him to start some range of motion exercises in the boot.  Once he is able to resume his exercises and his understanding of discomfort he can slowly start to transition to partial weightbearing in the cam boot over the next 2 weeks. -Ice/elevation -Pain medication as needed. -Monitor for any clinical signs or symptoms of infection and DVT/PE and directed to call the office immediately should any occur or go to the ER. -Follow-up as scheduled with Dr. Milinda Pointer or sooner if any problems arise. In the meantime, encouraged to call  the office with any questions, concerns, change in symptoms.   Celesta Gentile, DPM

## 2018-03-24 ENCOUNTER — Ambulatory Visit (INDEPENDENT_AMBULATORY_CARE_PROVIDER_SITE_OTHER): Payer: Self-pay | Admitting: Podiatry

## 2018-03-24 DIAGNOSIS — Z9889 Other specified postprocedural states: Secondary | ICD-10-CM

## 2018-03-24 DIAGNOSIS — M76822 Posterior tibial tendinitis, left leg: Secondary | ICD-10-CM

## 2018-03-24 DIAGNOSIS — M722 Plantar fascial fibromatosis: Secondary | ICD-10-CM

## 2018-03-24 NOTE — Progress Notes (Signed)
He presents today date of surgery 02/11/2018 status post EPF left and repair of the posterior tibial tendon left.  He states that he has some pain when the bottom of the heel but the medial longitudinal arch and the medial incision site appears to be doing pretty good.  He states that he has not been weightbearing but he has been partial weightbearing and seems to be doing fine with that.  Objective: Vital signs are stable he is alert and oriented x3 still Pez planus but has good inversion against resistance though it is tender on initial inversion.  He can keep it inverted with no pain but initial inversion is tender.  He still has tenderness on palpation of plantar aspect of the heel with what appears to be some scar tissue associated with the procedure.  Assessment: Status post EPF and Kidner posterior tibial tendon repair 02/11/2018.  Plan: We will allow him to wear of compression anklet and I will follow-up with him in 2 to 3 weeks

## 2018-04-07 ENCOUNTER — Encounter: Payer: Self-pay | Admitting: Podiatry

## 2018-04-07 ENCOUNTER — Telehealth: Payer: Self-pay | Admitting: *Deleted

## 2018-04-07 ENCOUNTER — Ambulatory Visit (INDEPENDENT_AMBULATORY_CARE_PROVIDER_SITE_OTHER): Payer: BC Managed Care – PPO | Admitting: Podiatry

## 2018-04-07 DIAGNOSIS — T148XXA Other injury of unspecified body region, initial encounter: Secondary | ICD-10-CM

## 2018-04-07 DIAGNOSIS — M76822 Posterior tibial tendinitis, left leg: Secondary | ICD-10-CM

## 2018-04-07 DIAGNOSIS — Z9889 Other specified postprocedural states: Secondary | ICD-10-CM

## 2018-04-07 DIAGNOSIS — M722 Plantar fascial fibromatosis: Secondary | ICD-10-CM

## 2018-04-07 NOTE — Telephone Encounter (Signed)
Hand delivered referral to [facility]. 

## 2018-04-07 NOTE — Telephone Encounter (Signed)
-----   Message from Rip Harbour, [facility] sent at 04/07/2018  9:27 AM EST ----- Regarding: PT [facility] - in house  S/p EPF, kidner with PT tendon repair left - DOS 02-11-18  Duration 3 x week x 4 weeks  Decrease pain, increase mobility and strengthening

## 2018-04-07 NOTE — Progress Notes (Signed)
He presents today date of surgery 02/11/2018 states that is doing a lot better as he refers to his posterior tibial tendon repair and left.  He states that today there is really no pain but I have been on it for long even partial weightbearing it is still painful.  He denies fever chills nausea vomiting muscle aches pains.  Objective: Foot looks much more normal vital signs are stable he is alert and oriented x3 minimal edema along the medial aspect of the foot he is got good inversion against resistance with mild tenderness.  Assessment: Well-healing Kidner procedure with repair posterior tibial tendon and EPF.  Plan: Discussed etiology pathology and surgical therapies at this point I feel that is necessary to get him into physical therapy to help control the pain that is associated with activities I feel that he is probably strong enough at this point to go through some physical therapy and I am asking he that he him to progress to full weightbearing.  I am going to continue him on his current work status and I will follow-up with him in the near future.

## 2018-04-28 ENCOUNTER — Encounter: Payer: Self-pay | Admitting: Podiatry

## 2018-04-28 ENCOUNTER — Ambulatory Visit (INDEPENDENT_AMBULATORY_CARE_PROVIDER_SITE_OTHER): Payer: BC Managed Care – PPO | Admitting: Podiatry

## 2018-04-28 ENCOUNTER — Ambulatory Visit: Payer: BC Managed Care – PPO | Admitting: Orthotics

## 2018-04-28 DIAGNOSIS — Z9889 Other specified postprocedural states: Secondary | ICD-10-CM

## 2018-04-28 DIAGNOSIS — T148XXA Other injury of unspecified body region, initial encounter: Secondary | ICD-10-CM

## 2018-04-28 DIAGNOSIS — M779 Enthesopathy, unspecified: Secondary | ICD-10-CM

## 2018-04-28 DIAGNOSIS — M76822 Posterior tibial tendinitis, left leg: Secondary | ICD-10-CM

## 2018-04-28 DIAGNOSIS — M722 Plantar fascial fibromatosis: Secondary | ICD-10-CM | POA: Diagnosis not present

## 2018-04-28 NOTE — Progress Notes (Addendum)
Patient came into today for casting bilateral f/o to address plantar fasciitis.  Patient reports history of foot pain involving plantar aponeurosis.  Goal is to provide longitudinal arch support and correct any RF instability due to heel eversion/inversion.  Ultimate goal is to relieve tension at pf insertion calcaneal tuberosity.  Plan on semi-rigid device addressing heel stability and relieving PF tension.     Dr. Milinda Pointer to drop in charges.

## 2018-04-28 NOTE — Progress Notes (Signed)
Presents today date of surgery 02/11/2018 status post EPF and repair of the posterior tibial tendon.  He states that is still little bit sore but all in all seems to be doing better.  Not a lot of pain.  Objective: Vital signs are stable alert and oriented x3.  Pulses are palpable.  Neurologic sensorium is intact he has good inversion against resistance.  Assessment: Well-healing surgical foot.  Plan: Number try to get him into physical therapy also get him in with Liliane Channel to have a set of orthotics made they will hold him more in a good rectus position and keep tension off of the posterior tibial tendon and the plantar fascia.

## 2018-05-10 ENCOUNTER — Ambulatory Visit: Payer: BC Managed Care – PPO | Admitting: Podiatry

## 2018-05-10 ENCOUNTER — Encounter: Payer: Self-pay | Admitting: Podiatry

## 2018-05-10 DIAGNOSIS — M722 Plantar fascial fibromatosis: Secondary | ICD-10-CM

## 2018-05-10 DIAGNOSIS — T8141XA Infection following a procedure, superficial incisional surgical site, initial encounter: Secondary | ICD-10-CM

## 2018-05-10 DIAGNOSIS — Z9889 Other specified postprocedural states: Secondary | ICD-10-CM

## 2018-05-10 DIAGNOSIS — T8149XA Infection following a procedure, other surgical site, initial encounter: Principal | ICD-10-CM

## 2018-05-10 DIAGNOSIS — M76822 Posterior tibial tendinitis, left leg: Secondary | ICD-10-CM

## 2018-05-10 DIAGNOSIS — T148XXA Other injury of unspecified body region, initial encounter: Secondary | ICD-10-CM

## 2018-05-10 NOTE — Progress Notes (Signed)
He presents today date of surgery 02/11/2018 status post EPF and a posterior tibial tendon repair on the left foot.  States that have noticed a small painful area at the end of the incision.  He states that it looked like this this morning as he showed me a photograph.  Objective: Vital signs stable he is alert oriented x3 moderate severe pes planus posterior tibial tendon is intact no fluctuance in the tendon sheath.  At the very distal aspect there appears to be a small pimple-like area which I cleaned with Betadine today and lanced with a #11 blade.  There is a small amount of purulence in it no more than a typical acne lesion.  However he will keep an eye on this.  Assessment: Mild stitch abscess.  Plan: No signs of infection today I did insist that he can soak this on Epsom salts and warm water and I will follow-up with him once he gets his orthotics.  Until then he is to remain out of work at least for another 6 weeks

## 2018-05-19 ENCOUNTER — Encounter: Payer: BC Managed Care – PPO | Admitting: Podiatry

## 2018-05-19 DIAGNOSIS — M79676 Pain in unspecified toe(s): Secondary | ICD-10-CM

## 2018-05-20 ENCOUNTER — Ambulatory Visit: Payer: BC Managed Care – PPO | Admitting: Orthotics

## 2018-05-20 ENCOUNTER — Other Ambulatory Visit: Payer: Self-pay

## 2018-05-20 DIAGNOSIS — M722 Plantar fascial fibromatosis: Secondary | ICD-10-CM

## 2018-05-20 DIAGNOSIS — T148XXA Other injury of unspecified body region, initial encounter: Secondary | ICD-10-CM

## 2018-05-20 DIAGNOSIS — Z9889 Other specified postprocedural states: Secondary | ICD-10-CM

## 2018-05-20 DIAGNOSIS — M76822 Posterior tibial tendinitis, left leg: Secondary | ICD-10-CM

## 2018-05-20 NOTE — Progress Notes (Signed)
Patient came in today to pick up [facility].  Patient was able to don independently and was satisfied with the fit and function.  Gait was observed with braces on, and there was marked improvement in all planes, especially ankle frontal plane.  Patient was advised of care and how to report any issues with brace.

## 2018-05-31 ENCOUNTER — Ambulatory Visit (INDEPENDENT_AMBULATORY_CARE_PROVIDER_SITE_OTHER): Payer: BC Managed Care – PPO | Admitting: Podiatry

## 2018-05-31 ENCOUNTER — Other Ambulatory Visit: Payer: Self-pay

## 2018-05-31 DIAGNOSIS — M76822 Posterior tibial tendinitis, left leg: Secondary | ICD-10-CM

## 2018-05-31 DIAGNOSIS — Z9889 Other specified postprocedural states: Secondary | ICD-10-CM

## 2018-05-31 DIAGNOSIS — T148XXA Other injury of unspecified body region, initial encounter: Secondary | ICD-10-CM

## 2018-05-31 DIAGNOSIS — M722 Plantar fascial fibromatosis: Secondary | ICD-10-CM

## 2018-05-31 NOTE — Progress Notes (Signed)
He presents today date of surgery was 02/11/2018 status post EPF and repair of posterior tibial tendon with Kidner procedure and cast.  He states that he is doing better he says it feels like he just needs to be worked.  He states that is a little stiff as he refers to the peroneal tendons of the left foot and ankle.  Objective: Vital signs are stable alert and oriented x3.  Pulses are palpable.  No pain on palpation of the medial aspect of the ankle that he does have some peroneal spasm in the left foot and ankle.  Assessment: Well-healing surgical foot and ankle.  Plan: At this point I will allow him to get back to work in the next week or so but he will utilize his brace from home.  I will follow-up with him in about a month or so to make sure he is doing well.

## 2018-06-02 ENCOUNTER — Encounter: Payer: BC Managed Care – PPO | Admitting: Podiatry

## 2018-10-21 IMAGING — DX DG CERVICAL SPINE COMPLETE 4+V
6 series · 6 of 6 positions shown · non-contrast
Comparison: None.

CLINICAL DATA: Fall 2 months ago.  Neck pain.  Left arm numbness.

EXAM:
CERVICAL SPINE - COMPLETE 4+ VIEW

[c-spine lat]
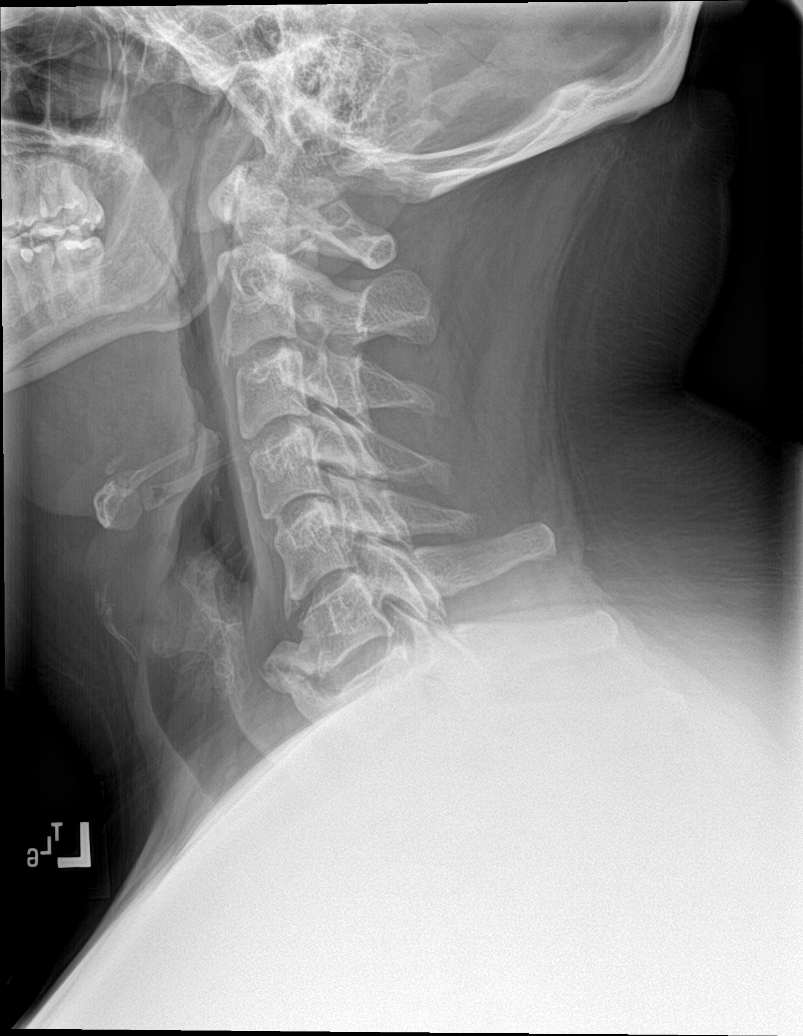

[c-spine obl (1 of 2)]
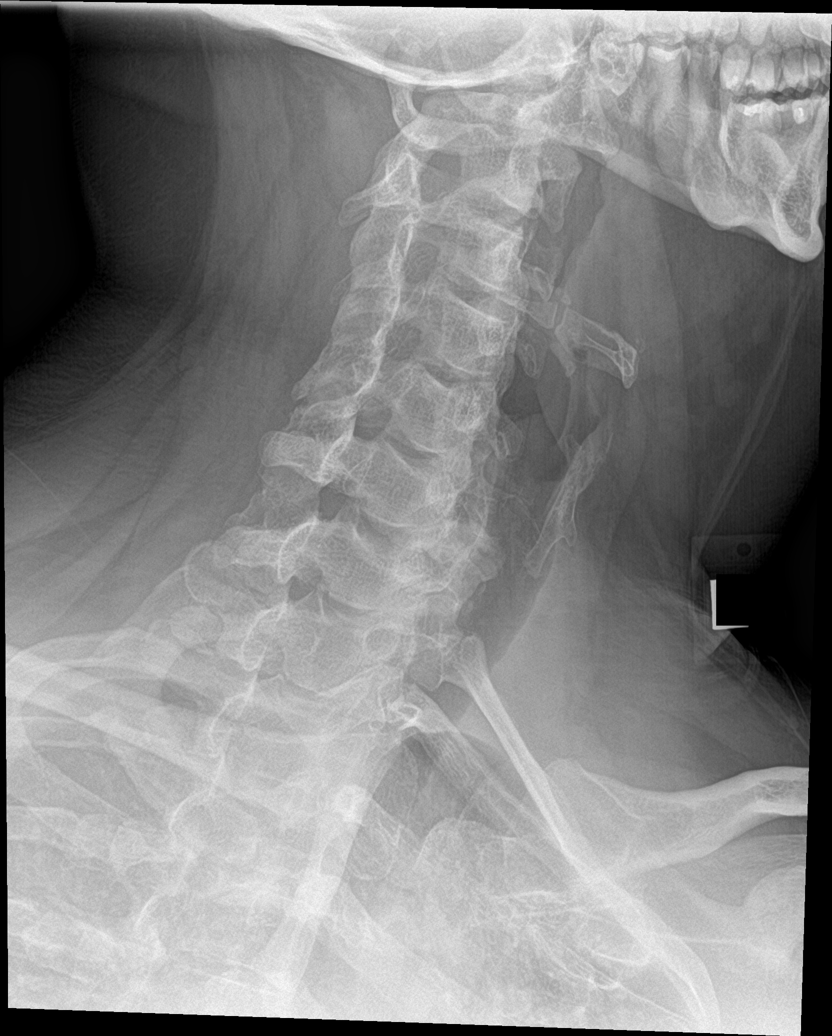

[c-spine obl (2 of 2)]
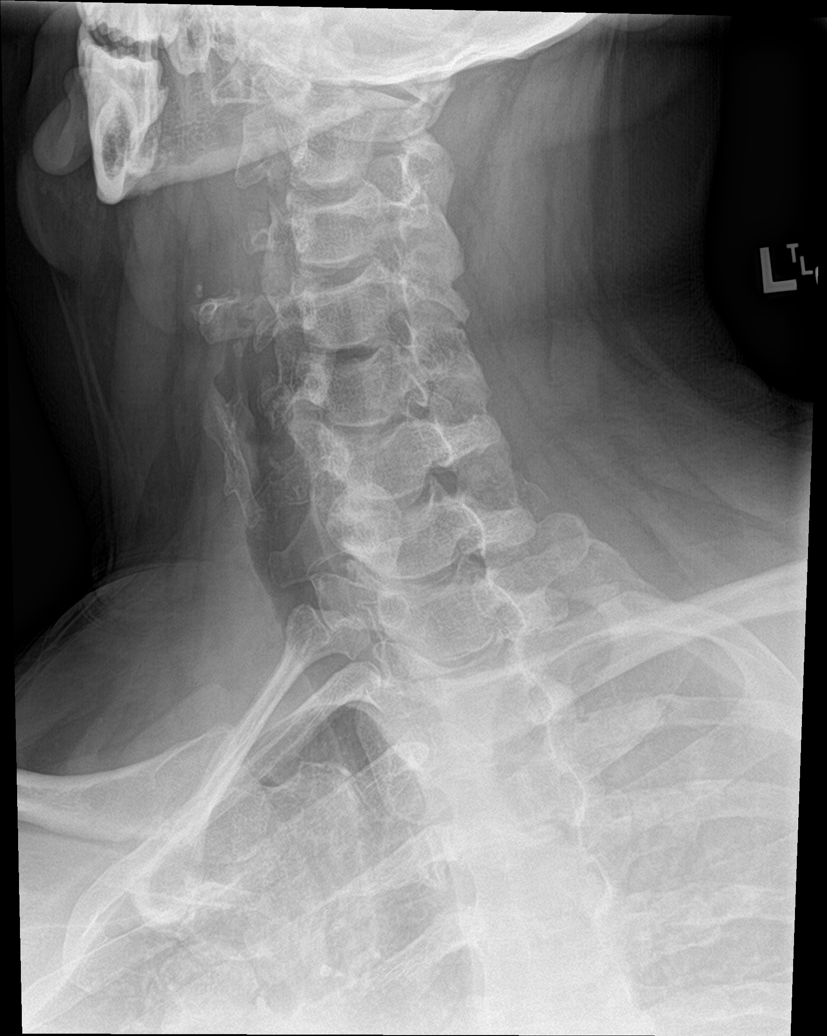

[c-spine ap]
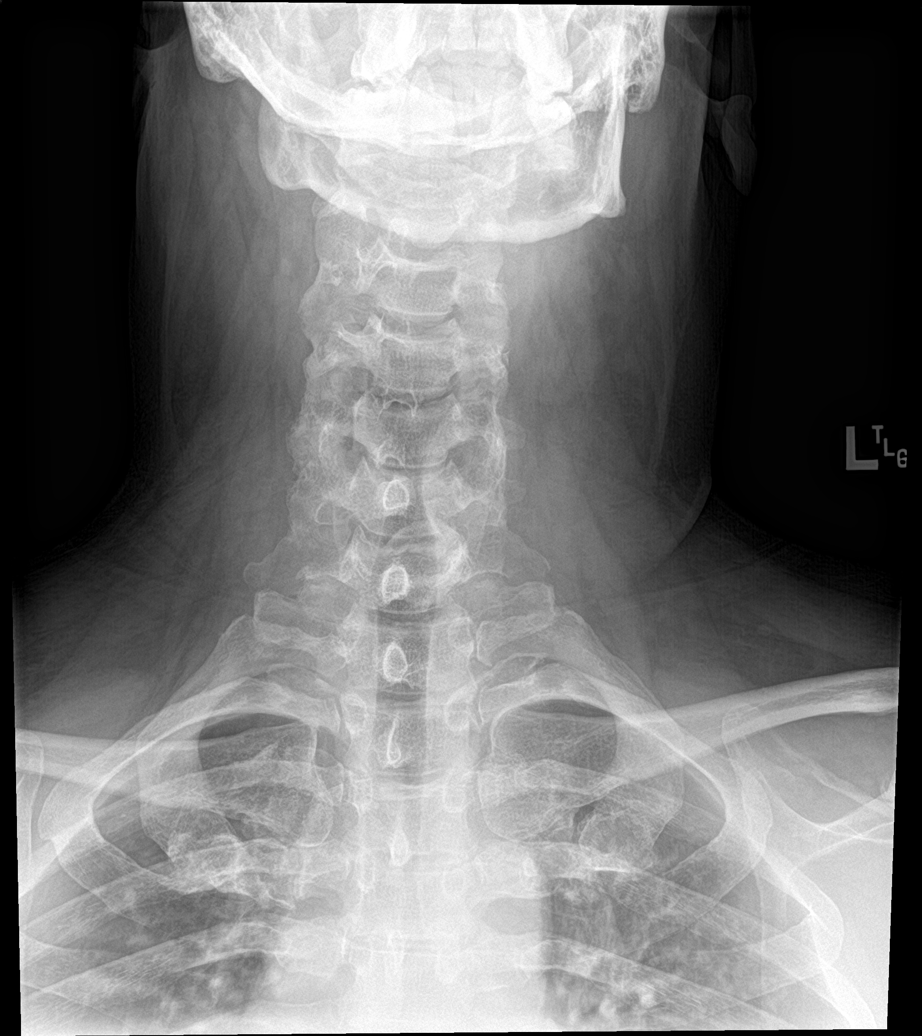

[c-spine open mouth (1 of 2)]
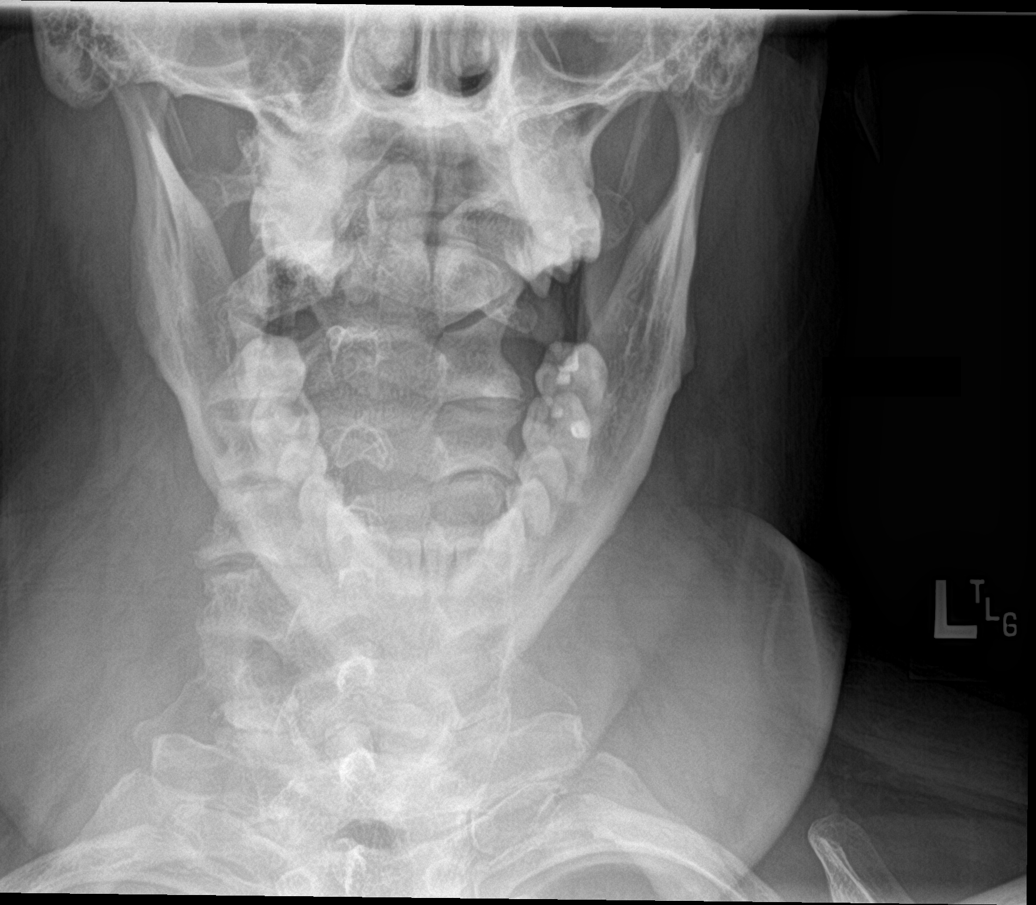

[c-spine open mouth (2 of 2)]
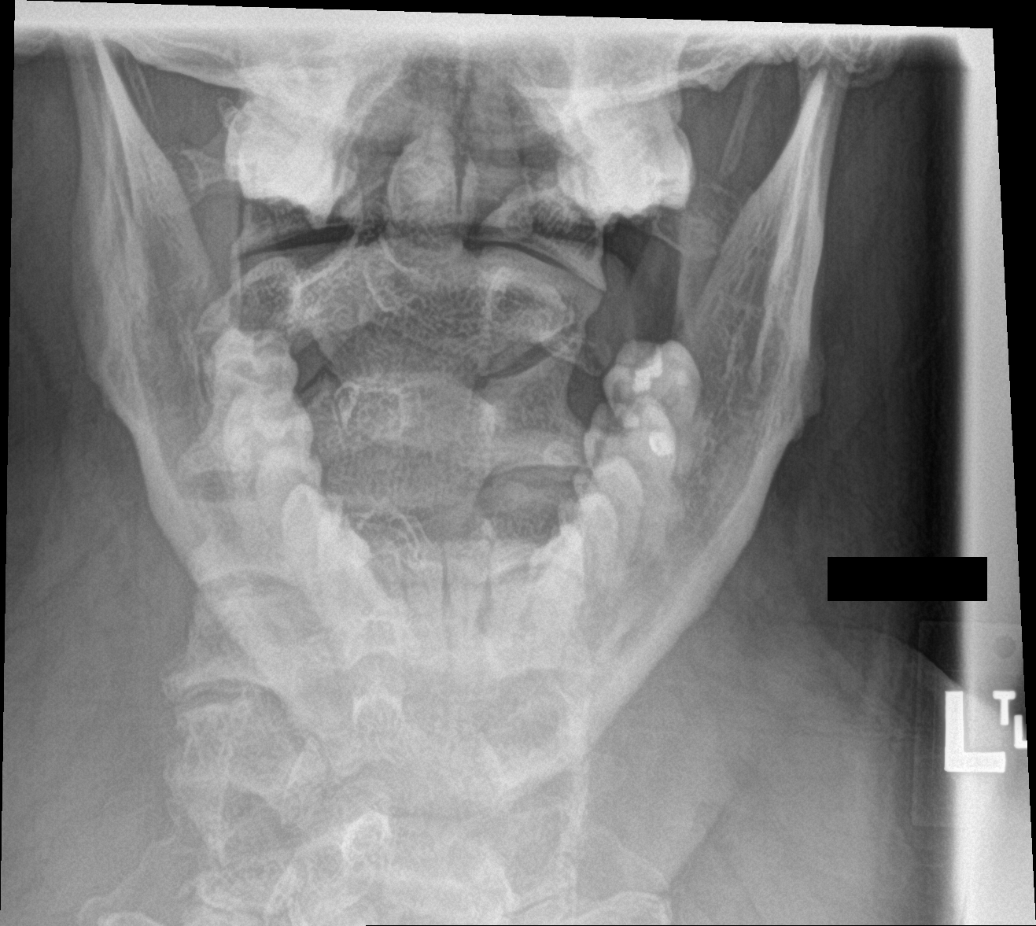

[6 of 6 positions shown; findings below may reference images not displayed]

FINDINGS: Large anterior osteophytes at C6-7. Mild anterior spurring at C2-3
and C5-6. Normal alignment. Prevertebral soft tissues are normal. No
fracture. Probable mild left neural foraminal narrowing at C7-T1.
IMPRESSION: Cervical spondylosis as above. Suspect mild left neural foraminal
narrowing at C7-T1. No acute bony abnormality.

## 2018-12-06 ENCOUNTER — Other Ambulatory Visit: Payer: Self-pay

## 2018-12-06 ENCOUNTER — Ambulatory Visit (INDEPENDENT_AMBULATORY_CARE_PROVIDER_SITE_OTHER): Payer: BC Managed Care – PPO | Admitting: Podiatry

## 2018-12-06 ENCOUNTER — Encounter: Payer: Self-pay | Admitting: Podiatry

## 2018-12-06 ENCOUNTER — Ambulatory Visit (INDEPENDENT_AMBULATORY_CARE_PROVIDER_SITE_OTHER): Payer: BC Managed Care – PPO | Admitting: Orthotics

## 2018-12-06 DIAGNOSIS — M7751 Other enthesopathy of right foot: Secondary | ICD-10-CM

## 2018-12-06 DIAGNOSIS — M7752 Other enthesopathy of left foot: Secondary | ICD-10-CM | POA: Diagnosis not present

## 2018-12-06 DIAGNOSIS — M76822 Posterior tibial tendinitis, left leg: Secondary | ICD-10-CM

## 2018-12-06 NOTE — Progress Notes (Signed)
Wants second pair ordered, this time dress and sulcus.

## 2018-12-07 ENCOUNTER — Encounter: Payer: Self-pay | Admitting: Podiatry

## 2018-12-07 NOTE — Progress Notes (Signed)
He presents today date of surgery 02/11/2018 EPF posterior tibial tendon repair states that he got a lot better for a while but now starting to hurt again but more on the other side as he refers to the lateral aspect of the left foot.  He is alert and oriented x3.  Pulses are palpable.  He has limited inversion of the left foot.  He has pain on palpation of the sinus tarsi left.  Assessment: Flatfoot with sinus tarsitis left.  Plan: After Betadine skin prep I injected the area today.  He tolerated procedure well without complications.  Follow-up with him as needed.

## 2019-01-03 ENCOUNTER — Other Ambulatory Visit: Payer: Self-pay

## 2019-01-03 ENCOUNTER — Ambulatory Visit: Payer: BC Managed Care – PPO | Admitting: Orthotics

## 2019-01-03 ENCOUNTER — Other Ambulatory Visit: Payer: BC Managed Care – PPO | Admitting: Orthotics

## 2019-01-03 DIAGNOSIS — M76822 Posterior tibial tendinitis, left leg: Secondary | ICD-10-CM

## 2019-01-03 DIAGNOSIS — T148XXA Other injury of unspecified body region, initial encounter: Secondary | ICD-10-CM

## 2019-01-03 DIAGNOSIS — M7752 Other enthesopathy of left foot: Secondary | ICD-10-CM

## 2019-01-03 NOTE — Progress Notes (Signed)
Patient came in today to pick up custom made foot orthotics.  The goals were accomplished and the patient reported no dissatisfaction with said orthotics.  Patient was advised of breakin period and how to report any issues. 

## 2019-01-17 ENCOUNTER — Ambulatory Visit: Payer: BC Managed Care – PPO | Admitting: Podiatry

## 2019-01-18 ENCOUNTER — Ambulatory Visit: Payer: BC Managed Care – PPO | Admitting: Podiatry

## 2019-01-30 ENCOUNTER — Ambulatory Visit: Payer: BC Managed Care – PPO | Admitting: Podiatry

## 2019-05-19 ENCOUNTER — Ambulatory Visit: Payer: BC Managed Care – PPO | Attending: Internal Medicine

## 2019-05-19 DIAGNOSIS — Z23 Encounter for immunization: Secondary | ICD-10-CM

## 2019-05-19 NOTE — Progress Notes (Signed)
   Covid-19 Vaccination Clinic  Name:  HAYDEN GERA    MRN: [id number] DOB: Jun 06, 1975  05/19/2019  Mr. Mathes was observed post Covid-19 immunization for 15 minutes without incident. He was provided with Vaccine Information Sheet and instruction to access the V-Safe system.   Mr. Holgate was instructed to call 911 with any severe reactions post vaccine: Marland Kitchen Difficulty breathing  . Swelling of face and throat  . A fast heartbeat  . A bad rash all over body  . Dizziness and weakness   Immunizations Administered    Name Date Dose VIS Date Route   Pfizer COVID-19 Vaccine 05/19/2019  8:18 AM 0.3 mL 02/17/2019 Intramuscular   Manufacturer: [place]   Lot: [id number]   [place]: [id number]

## 2019-06-03 ENCOUNTER — Ambulatory Visit: Payer: BC Managed Care – PPO

## 2019-06-12 ENCOUNTER — Ambulatory Visit: Payer: BC Managed Care – PPO | Attending: Internal Medicine

## 2019-06-12 DIAGNOSIS — Z23 Encounter for immunization: Secondary | ICD-10-CM

## 2019-06-12 NOTE — Progress Notes (Signed)
   Covid-19 Vaccination Clinic  Name:  OKIE JEFFORDS    MRN: [id number] DOB: 28-Sep-1975  06/12/2019  Mr. Mazzocchi was observed post Covid-19 immunization for 15 minutes without incident. He was provided with Vaccine Information Sheet and instruction to access the V-Safe system.   Mr. Panzarella was instructed to call 911 with any severe reactions post vaccine: Marland Kitchen Difficulty breathing  . Swelling of face and throat  . A fast heartbeat  . A bad rash all over body  . Dizziness and weakness   Immunizations Administered    Name Date Dose VIS Date Route   Pfizer COVID-19 Vaccine 06/12/2019  4:42 PM 0.3 mL 02/17/2019 Intramuscular   Manufacturer: [place]   Lot: [id number]   [place]: [id number]

## 2019-10-04 DIAGNOSIS — U071 COVID-19: Secondary | ICD-10-CM

## 2019-10-04 HISTORY — DX: COVID-19: U07.1

## 2020-03-04 ENCOUNTER — Other Ambulatory Visit (HOSPITAL_COMMUNITY): Payer: Self-pay | Admitting: Family Medicine

## 2020-03-04 DIAGNOSIS — R1011 Right upper quadrant pain: Secondary | ICD-10-CM

## 2020-03-07 ENCOUNTER — Ambulatory Visit (HOSPITAL_COMMUNITY)
Admission: RE | Admit: 2020-03-07 | Discharge: 2020-03-07 | Disposition: A | Discharge status: Home / Self Care | Payer: BC Managed Care – PPO | Source: Ambulatory Visit | Attending: Family Medicine | Admitting: Family Medicine

## 2020-03-07 ENCOUNTER — Other Ambulatory Visit: Payer: Self-pay

## 2020-03-07 DIAGNOSIS — R1011 Right upper quadrant pain: Secondary | ICD-10-CM | POA: Diagnosis present

## 2020-03-13 ENCOUNTER — Other Ambulatory Visit (HOSPITAL_COMMUNITY): Payer: Self-pay | Admitting: Family Medicine

## 2020-03-13 DIAGNOSIS — R1011 Right upper quadrant pain: Secondary | ICD-10-CM

## 2020-03-18 ENCOUNTER — Encounter (HOSPITAL_COMMUNITY)
Admission: RE | Admit: 2020-03-18 | Discharge: 2020-03-18 | Disposition: A | Discharge status: Home / Self Care | Payer: BC Managed Care – PPO | Source: Ambulatory Visit | Attending: Family Medicine | Admitting: Family Medicine

## 2020-03-18 ENCOUNTER — Other Ambulatory Visit: Payer: Self-pay

## 2020-03-18 DIAGNOSIS — R1011 Right upper quadrant pain: Secondary | ICD-10-CM | POA: Diagnosis not present

## 2020-03-18 MED ORDER — TECHNETIUM TC 99M MEBROFENIN IV KIT
5.0000 | PACK | Freq: Once | INTRAVENOUS | Status: AC | PRN
  Administered 2020-03-18: 5.5 via INTRAVENOUS

## 2020-03-19 ENCOUNTER — Encounter: Payer: Self-pay | Admitting: Internal Medicine

## 2020-04-22 ENCOUNTER — Encounter: Payer: Self-pay | Admitting: *Deleted

## 2020-05-06 ENCOUNTER — Encounter: Payer: Self-pay | Admitting: Internal Medicine

## 2020-05-06 ENCOUNTER — Ambulatory Visit: Payer: BC Managed Care – PPO | Admitting: Internal Medicine

## 2020-05-06 ENCOUNTER — Other Ambulatory Visit (INDEPENDENT_AMBULATORY_CARE_PROVIDER_SITE_OTHER): Payer: BC Managed Care – PPO

## 2020-05-06 VITALS — HR 83 | Ht 73.0 in | Wt 289.8 lb

## 2020-05-06 DIAGNOSIS — R110 Nausea: Secondary | ICD-10-CM | POA: Diagnosis not present

## 2020-05-06 DIAGNOSIS — R109 Unspecified abdominal pain: Secondary | ICD-10-CM

## 2020-05-06 DIAGNOSIS — R194 Change in bowel habit: Secondary | ICD-10-CM | POA: Diagnosis not present

## 2020-05-06 DIAGNOSIS — K219 Gastro-esophageal reflux disease without esophagitis: Secondary | ICD-10-CM | POA: Diagnosis not present

## 2020-05-06 DIAGNOSIS — R1011 Right upper quadrant pain: Secondary | ICD-10-CM

## 2020-05-06 DIAGNOSIS — R7989 Other specified abnormal findings of blood chemistry: Secondary | ICD-10-CM

## 2020-05-06 LAB — COMPREHENSIVE METABOLIC PANEL
ALT: 109 U/L — ABNORMAL HIGH (ref 0–53)
AST: 48 U/L — ABNORMAL HIGH (ref 0–37)
Albumin: 4.2 g/dL (ref 3.5–5.2)
Alkaline Phosphatase: 60 U/L (ref 39–117)
BUN: 20 mg/dL (ref 6–23)
CO2: 26 mEq/L (ref 19–32)
Calcium: 9 mg/dL (ref 8.4–10.5)
Chloride: 103 mEq/L (ref 96–112)
Creatinine, Ser: 0.91 mg/dL (ref 0.40–1.50)
GFR: 101.61 mL/min (ref >60.00)
Glucose, Bld: 112 mg/dL — ABNORMAL HIGH (ref 70–99)
Potassium: 3.9 mEq/L (ref 3.5–5.1)
Sodium: 137 mEq/L (ref 135–145)
Total Bilirubin: 0.5 mg/dL (ref 0.2–1.2)
Total Protein: 7.1 g/dL (ref 6.0–8.3)

## 2020-05-06 LAB — CBC WITH DIFFERENTIAL/PLATELET
Basophils Absolute: 0.1 10*3/uL (ref 0.0–0.1)
Basophils Relative: 0.6 % (ref 0.0–3.0)
Eosinophils Absolute: 0.1 10*3/uL (ref 0.0–0.7)
Eosinophils Relative: 1.3 % (ref 0.0–5.0)
HCT: 45 % (ref 39.0–52.0)
Hemoglobin: 15.3 g/dL (ref 13.0–17.0)
Lymphocytes Relative: 31 % (ref 12.0–46.0)
Lymphs Abs: 2.6 10*3/uL (ref 0.7–4.0)
MCHC: 34 g/dL (ref 30.0–36.0)
MCV: 83.7 fl (ref 78.0–100.0)
Monocytes Absolute: 0.7 10*3/uL (ref 0.1–1.0)
Monocytes Relative: 8.3 % (ref 3.0–12.0)
Neutro Abs: 4.9 10*3/uL (ref 1.4–7.7)
Neutrophils Relative %: 58.8 % (ref 43.0–77.0)
Platelets: 218 10*3/uL (ref 150.0–400.0)
RBC: 5.38 Mil/uL (ref 4.22–5.81)
RDW: 14 % (ref 11.5–15.5)
WBC: 8.4 10*3/uL (ref 4.0–10.5)

## 2020-05-06 LAB — PROTIME-INR
INR: 1.1 ratio — ABNORMAL HIGH (ref 0.8–1.0)
Prothrombin Time: 11.8 s (ref 9.6–13.1)

## 2020-05-06 MED ORDER — SUTAB 1479-225-188 MG PO TABS: 24.0000 | ORAL_TABLET | ORAL | 0 refills | Status: DC

## 2020-05-06 MED ORDER — DICYCLOMINE HCL 20 MG PO TABS: 20.0000 mg | ORAL_TABLET | Freq: Three times a day (TID) | ORAL | 1 refills | Status: DC

## 2020-05-06 MED ORDER — DEXLANSOPRAZOLE 60 MG PO CPDR: 60.0000 mg | DELAYED_RELEASE_CAPSULE | Freq: Every day | ORAL | 3 refills | Status: AC

## 2020-05-06 NOTE — Progress Notes (Signed)
Patient ID: Samuel Campbell, male   DOB: 1975-09-25, 45 y.o.   MRN: [id number] HPI: Samuel Campbell is a 45 year old male with a history of GERD diabetes, hypertension, sleep apnea who seen in consult at the request of Samuel Campbell to evaluate abdominal pain, uncontrolled GERD with nausea and change in bowel habit.  He is here alone today.  He reports that in December and probably if for a few weeks before this he developed right-sided abdominal pain predominantly right upper quadrant pain.  This was intermittent and would radiate through to his right back and right shoulder blade.  This was associated with nausea and also worsening of his GERD and heartburn symptom.  He has not vomited though at times he has felt like he needed to vomit or that this might help.  Worse somewhat in the morning.  Does not seem to have any rhyme or reason with what he eats but he knows that onions seem to make his symptoms worse.  During this time his omeprazole which she had taken for years for GERD was increased from once daily to twice daily.  This makes his heartburn symptoms "tolerable" but he is still having heartburn and indigestion.  Nausea remains.  The right upper quadrant pain has actually improved and now he is having left-sided abdominal pain both in the left middle and lower abdomen.  This is associated with a burning and bloating type symptom.  Bowel movements have changed in nature over the last 2 to 3 months.  Previously he had 1 formed stool daily.  Most recently he can have no stools a day or 3 stools daily.  Stools have been smaller and thinner at times.  They have changed in color as well from at times a "pasty yellow" or at other times like "red clay".  There is been no consistent bowel movements of late.  No definite visible red blood or melena.  He had an abdominal ultrasound on 03/07/2020 which showed fatty liver but no other abnormalities.  A HIDA scan was performed on 03/18/2020 which showed ducts patent with  an EF of 36%.  He did have nausea and upper bloating symptom after Ensure.  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  He is married.  Past tobacco use but none now.  No alcohol.  Family history notable for celiac disease and colon polyps.  He has never had endoscopic evaluation.  Past Medical History:  Diagnosis Date  . Arthritis   . Asthma   . Diabetes mellitus without complication ([place])   . Elevated LFTs   . GERD (gastroesophageal reflux disease)   . Hepatic steatosis   . High blood pressure   . Obesity   . Sleep apnea     Past Surgical History:  Procedure Laterality Date  . ANKLE SURGERY    . CARPAL TUNNEL RELEASE Right   . FOOT SURGERY Left   . TONSILLECTOMY    . WISDOM TOOTH EXTRACTION      Outpatient Medications Prior to Visit  Medication Sig Dispense Refill  . aspirin 81 MG chewable tablet Chew 81 mg by mouth daily.    . budesonide-formoterol (SYMBICORT) 80-4.5 MCG/ACT inhaler Inhale 2 puffs into the lungs as needed.    . Cholecalciferol (VITAMIN D3 PO) Take 1 tablet by mouth daily.    Marland Kitchen ELDERBERRY PO Take 1 tablet by mouth daily.    . fluticasone (FLONASE) 50 MCG/ACT  nasal spray Place 1 spray into both nostrils 2 (two) times daily.    . hydrochlorothiazide (HYDRODIURIL) 25 MG tablet Take 25 mg by mouth daily.    Marland Kitchen ipratropium (ATROVENT) 0.03 % nasal spray Place 1 spray into both nostrils as needed.  5  . levocetirizine (XYZAL) 5 MG tablet Take 5 mg by mouth every evening.    Marland Kitchen losartan (COZAAR) 100 MG tablet Take 100 mg by mouth daily.    . montelukast (SINGULAIR) 10 MG tablet Take 10 mg by mouth at bedtime.    . Olopatadine HCl 0.2 % SOLN Place 1 drop into both eyes once a week.    Marland Kitchen PROAIR HFA 108 (90 Base) MCG/ACT inhaler Inhale 2 puffs into the lungs as needed.  2  . vitamin C (ASCORBIC ACID) 500 MG tablet Take 1 tablet by mouth daily.    . Zinc 50 MG TABS Take 1  tablet by mouth daily.    Marland Kitchen omeprazole (PRILOSEC) 40 MG capsule Take 40 mg by mouth 2 (two) times daily.  4  . meloxicam (MOBIC) 15 MG tablet Take 1 tablet (15 mg total) by mouth daily. 30 tablet 3  . metFORMIN (GLUCOPHAGE) 500 MG tablet Take by mouth 2 (two) times daily with a meal.    . mupirocin ointment (BACTROBAN) 2 %   2   No facility-administered medications prior to visit.    No Known Allergies  Family History  Problem Relation Age of Onset  . Hypertension Mother   . Celiac disease Mother   . Colon polyps Mother   . Diabetes Father   . Alzheimer's disease Maternal Grandmother   . COPD Maternal Grandfather   . Asthma Maternal Grandfather   . Diabetes Paternal Grandmother   . Heart disease Paternal Grandmother   . Diabetes Paternal Grandfather   . Cirrhosis Other        Great uncle  . Colon cancer Neg Hx   . Stomach cancer Neg Hx   . Esophageal cancer Neg Hx   . Pancreatic cancer Neg Hx     Social History   Tobacco Use  . Smoking status: Former Smoker    Types: Cigarettes  . Smokeless tobacco: Former Network engineer  . Vaping Use: Never used  Substance Use Topics  . Alcohol use: Never  . Drug use: Never    ROS: As per history of present illness, otherwise negative  Pulse 83   Ht 6\' 1"  (1.854 m)   Wt 289 lb 12.8 oz (131.5 kg)   SpO2 99%   BMI 38.23 kg/m  Constitutional: Well-developed and well-nourished. No distress. HEENT: Normocephalic and atraumatic.  Conjunctivae are normal.  No scleral icterus. Neck: Neck supple. Trachea midline. Cardiovascular: Normal rate, regular rhythm and intact distal pulses. No M/R/G Pulmonary/chest: Effort normal and breath sounds normal. No wheezing, rales or rhonchi. Abdominal: Soft, obese, nontender, nondistended. Bowel sounds active throughout.  Extremities: no clubbing, cyanosis, or edema Neurological: Alert and oriented to person place and time. Skin: Skin is warm and dry.  Psychiatric: Normal mood and affect.  Behavior is normal.  RELEVANT LABS AND IMAGING: CBC    Component Value Date/Time   WBC 8.4 05/06/2020 1139   RBC 5.38 05/06/2020 1139   HGB 15.3 05/06/2020 1139   HCT 45.0 05/06/2020 1139   PLT 218.0 05/06/2020 1139   MCV 83.7 05/06/2020 1139   MCHC 34.0 05/06/2020 1139   RDW 14.0 05/06/2020 1139   LYMPHSABS 2.6 05/06/2020 1139   MONOABS 0.7  05/06/2020 1139   EOSABS 0.1 05/06/2020 1139   BASOSABS 0.1 05/06/2020 1139    CMP     Component Value Date/Time   NA 137 05/06/2020 1139   K 3.9 05/06/2020 1139   CL 103 05/06/2020 1139   CO2 26 05/06/2020 1139   GLUCOSE 112 (H) 05/06/2020 1139   BUN 20 05/06/2020 1139   CREATININE 0.91 05/06/2020 1139   CALCIUM 9.0 05/06/2020 1139   PROT 7.1 05/06/2020 1139   ALBUMIN 4.2 05/06/2020 1139   AST 48 (H) 05/06/2020 1139   ALT 109 (H) 05/06/2020 1139   ALKPHOS 60 05/06/2020 1139   BILITOT 0.5 05/06/2020 1139   Lab work from primary care reviewed dated 02/26/2020: AST 39, ALT 82, total bilirubin 0.4, alkaline phosphatase 59, albumin 4.5, total protein 6.8 Lipase 42  ABDOMEN ULTRASOUND COMPLETE   COMPARISON:  Report of CT Abdomen and Pelvis 05/03/2002 (no images available).   FINDINGS: Gallbladder: No gallstones or wall thickening visualized. No sonographic Murphy sign noted by sonographer.   Common bile duct: Diameter: 4 mm, normal.   Liver: Echogenic liver (images 15, 34). No discrete liver lesion. Portal vein is patent on color Doppler imaging with normal direction of blood flow towards the liver.   IVC: No abnormality visualized.   Pancreas: Obscured by overlying bowel gas.   Spleen: Size and appearance within normal limits.   Right Kidney: Length: 11.5 cm. Echogenicity within normal limits. No mass or hydronephrosis visualized.   Left Kidney: Length: 12.9 cm. Echogenicity within normal limits. No mass or hydronephrosis visualized.   Abdominal aorta: Incompletely visualized due to overlying bowel gas, visualized  portions within normal limits.   Other findings: None.   IMPRESSION: 1. Hepatic steatosis. 2. Otherwise normal ultrasound appearance of the visible abdomen.     Electronically Signed   By: Genevie Ann M.D.   On: 03/07/2020 09:14   NUCLEAR MEDICINE HEPATOBILIARY IMAGING WITH GALLBLADDER EF   TECHNIQUE: Sequential images of the abdomen were obtained out to 60 minutes following intravenous administration of radiopharmaceutical. After oral ingestion of Ensure, gallbladder ejection fraction was determined. At 60 min, normal ejection fraction is greater than 33%.   RADIOPHARMACEUTICALS:  5.5 mCi Tc-10m  Choletec IV   COMPARISON:  None   FINDINGS: Normal tracer extraction from bloodstream indicating normal hepatocellular function.   Normal excretion of tracer into biliary tree.   Gallbladder visualized at 12 min.   Small bowel visualized at 10 min.   No hepatic retention of tracer.   Subjectively normal emptying of tracer from gallbladder following fatty meal stimulation.   Calculated gallbladder ejection fraction is 36%, normal.   Patient reported nausea following Ensure ingestion.   Normal gallbladder ejection fraction following Ensure ingestion is greater than 33% at 1 hour.   IMPRESSION: Patent biliary tree with normal gallbladder ejection fraction of 36% following fatty meal stimulation.   Patient reported nausea following Ensure ingestion.     Electronically Signed   By: Lavonia Dana M.D.   On: 03/18/2020 14:35   ASSESSMENT/PLAN: 45 year old male with a history of GERD diabetes, hypertension, sleep apnea who seen in consult at the request of Samuel Campbell to evaluate abdominal pain, uncontrolled GERD with nausea and change in bowel habit.    1.  GERD (recently uncontrolled)/nausea/intermittent right upper quadrant pain/left-sided abdominal pain/change in bowel habit --he has symptoms attributable to both the upper and lower GI tract.  The pain which she describes  in December and January sounds biliary in nature though his  ultrasound did not show gallstones.  His gallbladder ejection fraction was borderline normal raising the question of intermittent biliary dyskinesia.  The right upper quadrant pain has mostly improved though the nausea and uncontrolled GERD persist.  I have recommended additional lab work as well as an endoscopic evaluation.  I will also change his PPI and add Bentyl for crampy abdominal pain --EGD and colonoscopy; risks and benefits as well as alternatives discussed and he is agreeable and wishes to proceed --Discontinue omeprazole --Begin Dexilant 60 mg once daily --Bentyl 20 mg 3 times daily as needed crampy abdominal pain --CBC, CMP, INR, TTG plus IgA  2.  Elevated liver enzymes --repeating liver enzymes today.  Fatty liver seen by ultrasound which may explain liver enzyme abnormality.  If still up we may need to do additional lab work in the future to exclude other causes of liver inflammation.    DU:[id number], Jenny Reichmann, [place],  [place] [id number] \

## 2020-05-06 NOTE — Patient Instructions (Signed)
We have sent the following medications to your pharmacy for you to pick up at your convenience: Dicyclomine, Dexilant, Sutab   STOP: Omeprazole  START: [place]   Your provider has requested that you go to the basement level for lab work before leaving today. Press "B" on the elevator. The lab is located at the first door on the left as you exit the elevator.  You have been scheduled for an endoscopy and colonoscopy. Please follow the written instructions given to you at your visit today. Please pick up your prep supplies at the pharmacy within the next 1-3 days. If you use inhalers (even only as needed), please bring them with you on the day of your procedure.   Due to recent changes in healthcare laws, you may see the results of your imaging and laboratory studies on MyChart before your provider has had a chance to review them.  We understand that in some cases there may be results that are confusing or concerning to you. Not all laboratory results come back in the same time frame and the provider may be waiting for multiple results in order to interpret others.  Please give [place] 48 hours in order for your provider to thoroughly review all the results before contacting the office for clarification of your results.    If you are age 5 or younger, your body mass index should be between 19-25. Your Body mass index is 38.23 kg/m. If this is out of the aformentioned range listed, please consider follow up with your Primary Care Provider.   Thank you for choosing me and [facility].  Dr. Ulice Dash Pyrtle

## 2020-05-07 LAB — IGA: Immunoglobulin A: 182 mg/dL (ref 47–310)

## 2020-05-07 LAB — TISSUE TRANSGLUTAMINASE, IGA: (tTG) Ab, IgA: <1 U/mL

## 2020-05-08 ENCOUNTER — Other Ambulatory Visit: Payer: Self-pay

## 2020-05-08 DIAGNOSIS — R1011 Right upper quadrant pain: Secondary | ICD-10-CM

## 2020-05-08 DIAGNOSIS — R7989 Other specified abnormal findings of blood chemistry: Secondary | ICD-10-CM

## 2020-07-01 ENCOUNTER — Other Ambulatory Visit: Payer: Self-pay

## 2020-07-01 ENCOUNTER — Encounter: Payer: Self-pay | Admitting: Internal Medicine

## 2020-07-01 ENCOUNTER — Ambulatory Visit (AMBULATORY_SURGERY_CENTER): Payer: BC Managed Care – PPO | Admitting: Internal Medicine

## 2020-07-01 VITALS — BP 114/76 | HR 72 | Temp 98.0°F | Resp 15 | Ht 73.0 in | Wt 289.0 lb

## 2020-07-01 DIAGNOSIS — K449 Diaphragmatic hernia without obstruction or gangrene: Secondary | ICD-10-CM

## 2020-07-01 DIAGNOSIS — K219 Gastro-esophageal reflux disease without esophagitis: Secondary | ICD-10-CM

## 2020-07-01 DIAGNOSIS — R1011 Right upper quadrant pain: Secondary | ICD-10-CM

## 2020-07-01 DIAGNOSIS — D123 Benign neoplasm of transverse colon: Secondary | ICD-10-CM

## 2020-07-01 DIAGNOSIS — K2970 Gastritis, unspecified, without bleeding: Secondary | ICD-10-CM | POA: Diagnosis not present

## 2020-07-01 DIAGNOSIS — K2289 Other specified disease of esophagus: Secondary | ICD-10-CM

## 2020-07-01 DIAGNOSIS — D122 Benign neoplasm of ascending colon: Secondary | ICD-10-CM

## 2020-07-01 DIAGNOSIS — K635 Polyp of colon: Secondary | ICD-10-CM | POA: Diagnosis not present

## 2020-07-01 DIAGNOSIS — K319 Disease of stomach and duodenum, unspecified: Secondary | ICD-10-CM

## 2020-07-01 DIAGNOSIS — R194 Change in bowel habit: Secondary | ICD-10-CM

## 2020-07-01 DIAGNOSIS — R109 Unspecified abdominal pain: Secondary | ICD-10-CM

## 2020-07-01 MED ORDER — SODIUM CHLORIDE 0.9 % IV SOLN: 500.0000 mL | Freq: Once | INTRAVENOUS | Status: DC

## 2020-07-01 NOTE — Patient Instructions (Signed)
Read all of the handouts given to you by your recovery room nurse. ? ?YOU HAD AN ENDOSCOPIC PROCEDURE TODAY AT THE [facility]:   Refer to the procedure report that was given to you for any specific questions about what was found during the examination.  If the procedure report does not answer your questions, please call your gastroenterologist to clarify.  If you requested that your care partner not be given the details of your procedure findings, then the procedure report has been included in a sealed envelope for you to review at your convenience later. ? ?YOU SHOULD EXPECT: Some feelings of bloating in the abdomen. Passage of more gas than usual.  Walking can help get rid of the air that was put into your GI tract during the procedure and reduce the bloating. If you had a lower endoscopy (such as a colonoscopy or flexible sigmoidoscopy) you may notice spotting of blood in your stool or on the toilet paper. If you underwent a bowel prep for your procedure, you may not have a normal bowel movement for a few days. ? ?Please Note:  You might notice some irritation and congestion in your nose or some drainage.  This is from the oxygen used during your procedure.  There is no need for concern and it should clear up in a day or so. ? ?SYMPTOMS TO REPORT IMMEDIATELY: ? ?Following lower endoscopy (colonoscopy or flexible sigmoidoscopy): ? Excessive amounts of blood in the stool ? Significant tenderness or worsening of abdominal pains ? Swelling of the abdomen that is new, acute ? Fever of 100?F or higher ? ?Following upper endoscopy (EGD) ? Vomiting of blood or coffee ground material ? New chest pain or pain under the shoulder blades ? Painful or persistently difficult swallowing ? New shortness of breath ? Fever of 100?F or higher ? Black, tarry-looking stools ? ?For urgent or emergent issues, a gastroenterologist can be reached at any hour by calling [phone]. ?Do not use MyChart messaging for urgent  concerns.  ? ? ?DIET:  We do recommend a small meal at first, but then you may proceed to your regular diet.  Drink plenty of fluids but you should avoid alcoholic beverages for 24 hours. ? ?ACTIVITY:  You should plan to take it easy for the rest of today and you should NOT DRIVE or use heavy machinery until tomorrow (because of the sedation medicines used during the test).   ? ?FOLLOW UP: ?Our staff will call the number listed on your records 48-72 hours following your procedure to check on you and address any questions or concerns that you may have regarding the information given to you following your procedure. If we do not reach you, we will leave a message.  We will attempt to reach you two times.  During this call, we will ask if you have developed any symptoms of COVID 19. If you develop any symptoms (ie: fever, flu-like symptoms, shortness of breath, cough etc.) before then, please call [phone].  If you test positive for Covid 19 in the 2 weeks post procedure, please call and report this information to us.   ? ?If any biopsies were taken you will be contacted by phone or by letter within the next 1-3 weeks.  Please call us at [phone] if you have not heard about the biopsies in 3 weeks.  ? ? ?SIGNATURES/CONFIDENTIALITY: ?You and/or your care partner have signed paperwork which will be entered into your electronic medical record.    record.  These signatures attest to the fact that that the information above on your After Visit Summary has been reviewed and is understood.  Full responsibility of the confidentiality of this discharge information lies with you and/or your care-partner. 

## 2020-07-01 NOTE — Progress Notes (Signed)
Called to room to assist during endoscopic procedure.  Patient ID and intended procedure confirmed with present staff. Received instructions for my participation in the procedure from the performing physician.  

## 2020-07-01 NOTE — Op Note (Signed)
[facility] Patient Name: Samuel Campbell Procedure Date: 07/01/2020 2:31 PM MRN: [id number] Endoscopist: Jerene Bears , MD Age: 45 Referring MD:  Date of Birth: 1975/07/19 Gender: Male Account #: [id number] Procedure:                Upper GI endoscopy Indications:              Heartburn/GERD, intermittent RUQ pain, left-sided                            abdominal pain Medicines:                Monitored Anesthesia Care Procedure:                Pre-Anesthesia Assessment:                           - Prior to the procedure, a History and Physical                            was performed, and patient medications and                            allergies were reviewed. The patient's tolerance of                            previous anesthesia was also reviewed. The risks                            and benefits of the procedure and the sedation                            options and risks were discussed with the patient.                            All questions were answered, and informed consent                            was obtained. Prior Anticoagulants: The patient has                            taken no previous anticoagulant or antiplatelet                            agents. ASA Grade Assessment: III - A patient with                            severe systemic disease. After reviewing the risks                            and benefits, the patient was deemed in                            satisfactory condition to undergo the procedure.  After obtaining informed consent, the endoscope was                            passed under direct vision. Throughout the                            procedure, the patient's blood pressure, pulse, and                            oxygen saturations were monitored continuously. The                            Endoscope was introduced through the mouth, and                            advanced to the second part of duodenum. The  upper                            GI endoscopy was accomplished without difficulty.                            The patient tolerated the procedure well. Scope In: Scope Out: Findings:                 Mild mucosal changes characterized by subtle rings                            were found in the lower third of the esophagus. No                            strictures. Z-line regular at 40 cm. Biopsies were                            taken with a cold forceps for histology to evaluate                            for acid reflux change and to exclude EoE.                           Mild inflammation characterized by erosions (antrum                            only) and erythema was found in the gastric fundus,                            in the gastric body and in the gastric antrum.                            Biopsies were taken with a cold forceps for                            histology and Helicobacter pylori testing.  The examined duodenum was normal. Complications:            No immediate complications. Estimated Blood Loss:     Estimated blood loss was minimal. Impression:               - Subtle mucosal rings in the lower esophagus                            (likely GERD related) without stricture or                            stenosis. Z-line regular. Biopsied.                           - Mild gastritis. Biopsied to exclude H. Pylori.                           - Normal examined duodenum. Recommendation:           - Patient has a contact number available for                            emergencies. The signs and symptoms of potential                            delayed complications were discussed with the                            patient. Return to normal activities tomorrow.                            Written discharge instructions were provided to the                            patient.                           - Resume previous diet.                            - Continue present medications.                           - Await pathology results.                           - Changes seen today (in the absence of H. Pylori)                            are not felt significant enough to explain                            intermittent RUQ pain. If pathology unrevealing                            then surgical consult for consideration of  laparoscopic cholecystectomy is recommended. Jerene Bears, MD 07/01/2020 3:30:50 PM This report has been signed electronically.

## 2020-07-01 NOTE — Op Note (Signed)
[place] Patient Name: Samuel Campbell Procedure Date: 07/01/2020 2:31 PM MRN: [id number] Endoscopist: Jerene Bears , MD Age: 45 Referring MD:  Date of Birth: Dec 26, 1975 Gender: Male Account #: [id number] Procedure:                Colonoscopy Indications:              Abdominal pain in the left abdomen, Change in bowel                            habits Medicines:                Monitored Anesthesia Care Procedure:                Pre-Anesthesia Assessment:                           - Prior to the procedure, a History and Physical                            was performed, and patient medications and                            allergies were reviewed. The patient's tolerance of                            previous anesthesia was also reviewed. The risks                            and benefits of the procedure and the sedation                            options and risks were discussed with the patient.                            All questions were answered, and informed consent                            was obtained. Prior Anticoagulants: The patient has                            taken no previous anticoagulant or antiplatelet                            agents. ASA Grade Assessment: III - A patient with                            severe systemic disease. After reviewing the risks                            and benefits, the patient was deemed in                            satisfactory condition to undergo the procedure.  After obtaining informed consent, the colonoscope                            was passed under direct vision. Throughout the                            procedure, the patient's blood pressure, pulse, and                            oxygen saturations were monitored continuously. The                            Olympus [id number] was introduced                            through the anus and advanced to the cecum,                             identified by appendiceal orifice and ileocecal                            valve. The colonoscopy was performed without                            difficulty. The patient tolerated the procedure                            well. The quality of the bowel preparation was                            good. The ileocecal valve, appendiceal orifice, and                            rectum were photographed. Scope In: 3:01:15 PM Scope Out: 3:21:07 PM Scope Withdrawal Time: 0 hours 17 minutes 11 seconds  Total Procedure Duration: 0 hours 19 minutes 52 seconds  Findings:                 The digital rectal exam was normal.                           Three sessile polyps were found in the ascending                            colon. The polyps were 3 to 6 mm in size. These                            polyps were removed with a cold snare. Resection                            and retrieval were complete.                           Two sessile polyps were found in the transverse  colon. The polyps were 4 to 5 mm in size. These                            polyps were removed with a cold snare. Resection                            and retrieval were complete.                           Internal hemorrhoids were found during                            retroflexion. The hemorrhoids were small.                           The exam was otherwise without abnormality. Complications:            No immediate complications. Estimated Blood Loss:     Estimated blood loss was minimal. Impression:               - Three 3 to 6 mm polyps in the ascending colon,                            removed with a cold snare. Resected and retrieved.                           - Two 4 to 5 mm polyps in the transverse colon,                            removed with a cold snare. Resected and retrieved.                           - Small internal hemorrhoids.                           - The  examination was otherwise normal. Recommendation:           - Patient has a contact number available for                            emergencies. The signs and symptoms of potential                            delayed complications were discussed with the                            patient. Return to normal activities tomorrow.                            Written discharge instructions were provided to the                            patient.                           -  Resume previous diet.                           - Continue present medications.                           - Await pathology results.                           - Repeat colonoscopy is recommended. The                            colonoscopy date will be determined after pathology                            results from today's exam become available for                            review. Jerene Bears, MD 07/01/2020 3:33:12 PM This report has been signed electronically.

## 2020-07-01 NOTE — Progress Notes (Signed)
PT taken to PACU. Monitors in place. VSS. Report given to RN. 

## 2020-07-03 ENCOUNTER — Telehealth: Payer: Self-pay

## 2020-07-03 NOTE — Telephone Encounter (Signed)
  Follow up Call-  Call back number 07/01/2020  Post procedure Call Back phone  # [phone]  Permission to leave phone message Yes  Some recent data might be hidden     Patient questions:  Do you have a fever, pain , or abdominal swelling? No. Pain Score  0 *  Have you tolerated food without any problems? Yes.    Have you been able to return to your normal activities? Yes.    Do you have any questions about your discharge instructions: Diet   No. Medications  No. Follow up visit  No.  Do you have questions or concerns about your Care? No.  Actions: * If pain score is 4 or above: No action needed, pain <4.  Have you developed a fever since your procedure? No 2.   Have you had an respiratory symptoms (SOB or cough) since your procedure? No  3.   Have you tested positive for COVID 19 since your procedure No  4.   Have you had any family members/close contacts diagnosed with the COVID 19 since your procedure?  No   If yes to any of these questions please route to Joylene John, RN and Joella Prince, RN

## 2020-07-22 ENCOUNTER — Telehealth: Payer: Self-pay | Admitting: Internal Medicine

## 2020-07-22 NOTE — Telephone Encounter (Signed)
Pt called stating that he was going to be referred to a surgeon for his gabladder but has not heard anything yet about it. Pls call him.

## 2020-07-22 NOTE — Telephone Encounter (Signed)
Spoke with pt and let him know the referral was faxed on 5/6, confirmation was received but [facility] states they did not get the fax. Referral refaxed. Pt aware and given the phone number to [facility] so he can check on the status of the referral tomorrow.

## 2020-08-12 ENCOUNTER — Ambulatory Visit: Payer: Self-pay | Admitting: Surgery

## 2020-08-12 ENCOUNTER — Encounter: Payer: Self-pay | Admitting: Surgery

## 2020-08-12 DIAGNOSIS — J4530 Mild persistent asthma, uncomplicated: Secondary | ICD-10-CM | POA: Insufficient documentation

## 2020-08-12 DIAGNOSIS — H1045 Other chronic allergic conjunctivitis: Secondary | ICD-10-CM | POA: Insufficient documentation

## 2020-08-12 DIAGNOSIS — E669 Obesity, unspecified: Secondary | ICD-10-CM | POA: Insufficient documentation

## 2020-08-12 DIAGNOSIS — J3081 Allergic rhinitis due to animal (cat) (dog) hair and dander: Secondary | ICD-10-CM | POA: Insufficient documentation

## 2020-08-12 DIAGNOSIS — R7303 Prediabetes: Secondary | ICD-10-CM | POA: Insufficient documentation

## 2020-08-12 DIAGNOSIS — K219 Gastro-esophageal reflux disease without esophagitis: Secondary | ICD-10-CM | POA: Insufficient documentation

## 2020-08-12 DIAGNOSIS — K811 Chronic cholecystitis: Secondary | ICD-10-CM | POA: Insufficient documentation

## 2020-08-12 NOTE — H&P (Signed)
Genelle Bal Appointment: 08/12/2020 4:00 PM Location: [facility] Patient #: [id number] DOB: 09/27/1975 Married / Language: Cleophus Molt / Race: White Male   History of Present Illness Adin Hector MD; 08/12/2020 4:55 PM) The patient is a 45 year old male who presents with abdominal pain. Note for "Abdominal pain": ` ` ` Patient sent for surgical consultation at the request of Zenovia Jarred, MD, [facility]  Chief Complaint: Right upper quadrant pain with nausea. Borderline gallbladder function. Possible biliary dyskinesia. ` ` The patient is a pleasant obese gentleman some struggling with intermittent abdominal pain. He has chronic heartburn and reflux. He is to be well controlled with Nexium. Lost insurance coverage so he's been trying to find a PPI that'll work through gastroenterology. Patient's had difference in his symptoms with right upper quadrant sharp pain that radiated to his back and shoulder. Usually triggered by eating. He is tried to adjust his diet. Raw vegetables especially onions or trigger. He gets nauseated and bloated. Not quite to the point of vomiting. An ultrasound that was negative. Had gallbladder ejection fraction that was borderline at 36% with reproduction of symptoms with Ensure by mouth bolus. Gastrology work to try and adjust his heartburn and reflux medicines. Did endoscopy on him which disproves any major esophagitis and maybe mild gastritis. H. pylori negative. Because of his persistent symptoms now worsening despite PPI without evidence of esophagitis, surgical consultation offered.  Patient has by himself. He is moderately active at work. He can walk about 20 minutes before he has to stop. He notes his bowels been somewhat irregular but no massive constipation or diarrhea. Does not think he has irritable bowel syndrome. He's never any abdominal surgery. Used to smoke but quit over a decade ago. He does have sleep apnea uses CPAP. He  was diagnosed with prediabetes. Was on oral metformin for a while. Now been able to be weaned off it. No cardiac or coronary issues that he is aware of  No personal nor family history of GI/colon cancer, inflammatory bowel disease, irritable bowel syndrome, allergy such as Celiac Sprue, dietary/dairy problems, colitis, ulcers nor gastritis. No recent sick contacts/gastroenteritis. No travel outside the country. No changes in diet. No dysphagia to solids or liquids. No significant heartburn or reflux. No melena, hematemesis, coffee ground emesis. No evidence of prior gastric/peptic ulceration.  (Review of systems as stated in this history (HPI) or in the review of systems. Otherwise all other 12 point ROS are negative) ` ` ###########################################`  This patient encounter took 30 minutes today to perform the following: obtain history, perform exam, review outside records, interpret tests & imaging, counsel the patient on their diagnosis; and, document this encounter, including findings & plan in the electronic health record (EHR).   Past Surgical History Janeann Forehand, CNA; 08/12/2020 4:02 PM) Colon Polyp Removal - Colonoscopy  Foot Surgery  Left. Oral Surgery   Diagnostic Studies History Janeann Forehand, CNA; 08/12/2020 4:02 PM) Colonoscopy  within last year  Allergies Janeann Forehand, CNA; 08/12/2020 4:02 PM) No Known Drug Allergies  [08/12/2020]: Allergies Reconciled   Medication History Janeann Forehand, CNA; 08/12/2020 4:03 PM) Virtussin A/C (100-10MG Laurian Brim Solution, Oral) Active. Cyclobenzaprine HCl (5MG  Tablet, Oral) Active. Dexilant (60MG  Capsule DR, Oral) Active. Dicyclomine HCl (20MG  Tablet, Oral) Active. Fluticasone Propionate (50MCG/ACT Suspension, Nasal) Active. hydroCHLOROthiazide (25MG  Tablet, Oral) Active. Ipratropium Bromide (0.03% Solution, Nasal) Active. Levocetirizine Dihydrochloride (5MG  Tablet, Oral) Active. Losartan  Potassium (100MG  Tablet, Oral) Active. Montelukast Sodium (10MG  Tablet, Oral) Active. Omeprazole (40MG  Capsule DR, Oral)  Active. predniSONE (10MG  Tablet, Oral) Active. EQ Event organiser. Sutab (1479-225-188MG  Tablet, Oral) Active. Symbicort (80-4.5MCG/ACT Aerosol, Inhalation) Active. Medications Reconciled  Social History Janeann Forehand, CNA; 08/12/2020 4:02 PM) Alcohol use  Remotely quit alcohol use. Caffeine use  Carbonated beverages, Coffee, Tea. No drug use  Tobacco use  Former smoker.  Family History Janeann Forehand, CNA; 08/12/2020 4:02 PM) Arthritis  Mother. Colon Polyps  Brother, Father, Mother. Diabetes Mellitus  Father. Hypertension  Father, Mother.  Other Problems Janeann Forehand, CNA; 08/12/2020 4:02 PM) Arthritis  Asthma  Back Pain  Diabetes Mellitus  Gastroesophageal Reflux Disease  High blood pressure  Sleep Apnea     Review of Systems Adriana Reams Alston CNA; 08/12/2020 4:02 PM) General Not Present- Appetite Loss, Chills, Fatigue, Fever, Night Sweats, Weight Gain and Weight Loss. Skin Not Present- Change in Wart/Mole, Dryness, Hives, Jaundice, New Lesions, Non-Healing Wounds, Rash and Ulcer. HEENT Present- Seasonal Allergies and Wears glasses/contact lenses. Not Present- Earache, Hearing Loss, Hoarseness, Nose Bleed, Oral Ulcers, Ringing in the Ears, Sinus Pain, Sore Throat, Visual Disturbances and Yellow Eyes. Cardiovascular Not Present- Chest Pain, Difficulty Breathing Lying Down, Leg Cramps, Palpitations, Rapid Heart Rate, Shortness of Breath and Swelling of Extremities. Gastrointestinal Present- Abdominal Pain, Bloating, Change in Bowel Habits, Gets full quickly at meals, Indigestion and Nausea. Not Present- Bloody Stool, Chronic diarrhea, Constipation, Difficulty Swallowing, Excessive gas, Hemorrhoids, Rectal Pain and Vomiting. Male Genitourinary Not Present- Blood in Urine, Change in Urinary Stream, Frequency, Impotence,  Nocturia, Painful Urination, Urgency and Urine Leakage. Musculoskeletal Not Present- Back Pain, Joint Pain, Joint Stiffness, Muscle Pain, Muscle Weakness and Swelling of Extremities. Neurological Not Present- Decreased Memory, Fainting, Headaches, Numbness, Seizures, Tingling, Tremor, Trouble walking and Weakness. Psychiatric Not Present- Anxiety, Bipolar, Change in Sleep Pattern, Depression, Fearful and Frequent crying. Endocrine Not Present- Cold Intolerance, Excessive Hunger, Hair Changes, Heat Intolerance, Hot flashes and New Diabetes. Hematology Not Present- Blood Thinners, Easy Bruising, Excessive bleeding, Gland problems, HIV and Persistent Infections.  Vitals (Donyelle Alston CNA; 08/12/2020 4:03 PM) 08/12/2020 4:03 PM Weight: 288.38 lb Height: 73in Body Surface Area: 2.51 m Body Mass Index: 38.05 kg/m  Temp.: 98.68F  Pulse: 88 (Regular)  P.OX: 96% (Room air) BP: 140/96(Sitting, Left Arm, Standard)       Physical Exam Adin Hector MD; 08/12/2020 4:48 PM) General Mental Status-Alert. General Appearance-Not in acute distress, Not Sickly. Orientation-Oriented X3. Hydration-Well hydrated. Voice-Normal.  Integumentary Global Assessment Upon inspection and palpation of skin surfaces of the - Axillae: non-tender, no inflammation or ulceration, no drainage. and Distribution of scalp and body hair is normal. General Characteristics Temperature - normal warmth is noted.  Head and Neck Head-normocephalic, atraumatic with no lesions or palpable masses. Face Global Assessment - atraumatic, no absence of expression. Neck Global Assessment - no abnormal movements, no bruit auscultated on the right, no bruit auscultated on the left, no decreased range of motion, non-tender. Trachea-midline. Thyroid Gland Characteristics - non-tender.  Eye Eyeball - Left-Extraocular movements intact, No Nystagmus - Left. Eyeball - Right-Extraocular movements intact,  No Nystagmus - Right. Cornea - Left-No Hazy - Left. Cornea - Right-No Hazy - Right. Sclera/Conjunctiva - Left-No scleral icterus, No Discharge - Left. Sclera/Conjunctiva - Right-No scleral icterus, No Discharge - Right. Pupil - Left-Direct reaction to light normal. Pupil - Right-Direct reaction to light normal.  ENMT Ears Pinna - Left - no drainage observed, no generalized tenderness observed. Pinna - Right - no drainage observed, no generalized tenderness observed. Nose and Sinuses External Inspection of the Nose -  no destructive lesion observed. Inspection of the nares - Left - quiet respiration. Inspection of the nares - Right - quiet respiration. Mouth and Throat Lips - Upper Lip - no fissures observed, no pallor noted. Lower Lip - no fissures observed, no pallor noted. Nasopharynx - no discharge present. Oral Cavity/Oropharynx - Tongue - no dryness observed. Oral Mucosa - no cyanosis observed. Hypopharynx - no evidence of airway distress observed.  Chest and Lung Exam Inspection Movements - Normal and Symmetrical. Accessory muscles - No use of accessory muscles in breathing. Palpation Palpation of the chest reveals - Non-tender. Auscultation Breath sounds - Normal and Clear.  Cardiovascular Auscultation Rhythm - Regular. Murmurs & Other Heart Sounds - Auscultation of the heart reveals - No Murmurs and No Systolic Clicks.  Abdomen Inspection Inspection of the abdomen reveals - No Visible peristalsis and No Abnormal pulsations. Umbilicus - No Bleeding, No Urine drainage. Palpation/Percussion Palpation and Percussion of the abdomen reveal - Soft, Non Tender, No Rebound tenderness, No Rigidity (guarding) and No Cutaneous hyperesthesia. Note: Right upper quadrant pain with sensitivity. Not quite a major Murphy sign. Minimal epigastric discomfort. The rest the abdomen is nontender.  Abdomen soft. Nontender. Not distended. Mild suprapubic umbilical diastases. Upper  abdomen rather thin. Bellybutton is sensitive but no obvious hernia. No umbilical or incisional hernias. No guarding.   Male Genitourinary Sexual Maturity Tanner 5 - Adult hair pattern and Adult penile size and shape.  Peripheral Vascular Upper Extremity Inspection - Left - No Cyanotic nailbeds - Left, Not Ischemic. Inspection - Right - No Cyanotic nailbeds - Right, Not Ischemic.  Neurologic Neurologic evaluation reveals -normal attention span and ability to concentrate, able to name objects and repeat phrases. Appropriate fund of knowledge , normal sensation and normal coordination. Mental Status Affect - not angry, not paranoid. Cranial Nerves-Normal Bilaterally. Gait-Normal.  Neuropsychiatric Mental status exam performed with findings of-able to articulate well with normal speech/language, rate, volume and coherence, thought content normal with ability to perform basic computations and apply abstract reasoning and no evidence of hallucinations, delusions, obsessions or homicidal/suicidal ideation.  Musculoskeletal Global Assessment Spine, Ribs and Pelvis - no instability, subluxation or laxity. Right Upper Extremity - no instability, subluxation or laxity.  Lymphatic Head & Neck  General Head & Neck Lymphatics: Bilateral - Description - No Localized lymphadenopathy. Axillary  General Axillary Region: Bilateral - Description - No Localized lymphadenopathy. Femoral & Inguinal  Generalized Femoral & Inguinal Lymphatics: Left - Description - No Localized lymphadenopathy. Right - Description - No Localized lymphadenopathy.    Assessment & Plan Adin Hector MD; 08/12/2020 4:49 PM) CHRONIC CHOLECYSTITIS WITHOUT CALCULUS (K81.1) Impression: Rather classic story biliary colic. Risks of the differential diagnosis is underwhelming. Reproduction of symptoms with Ensure bolus with borderline gallbladder function. I think he's exhausted other options. I think he would benefit  from cholecystectomy. Reasonable to start out single site. May need to do more incisions given his obesity. We will see. Low threshold to do core liver biopsy of liver looks abnormal bowel movement documented make sure there is no other concerning etiologies for his pain.  I did caution that success rate is less than pupils classic gallstones. However he's exhausted other options and he's had a rather thorough workup.  He is having worsening symptoms despite antiacid medication. He has seen GI. He is ready to proceed with surgery. Current Plans You are being scheduled for surgery- Our schedulers will call you.  You should hear from our office's scheduling department within 5 working  days about the location, date, and time of surgery. We try to make accommodations for patient's preferences in scheduling surgery, but sometimes the OR schedule or the surgeon's schedule prevents [place] from making those accommodations.  If you have not heard from our office [phone]) in 5 working days, call the office and ask for your surgeon's nurse.  If you have other questions about your diagnosis, plan, or surgery, call the office and ask for your surgeon's nurse.  Written instructions provided Pt Education - Pamphlet Given - Laparoscopic Gallbladder Surgery: discussed with patient and provided information. The anatomy & physiology of hepatobiliary & pancreatic function was discussed. The pathophysiology of gallbladder dysfunction was discussed. Natural history risks without surgery was discussed. I feel the risks of no intervention will lead to serious problems that outweigh the operative risks; therefore, I recommended cholecystectomy to remove the pathology. I explained laparoscopic techniques with possible need for an open approach. Probable cholangiogram to evaluate the bilary tract was explained as well.  Risks such as bleeding, infection, abscess, leak, injury to other organs, need for further  treatment, heart attack, death, and other risks were discussed. I noted a good likelihood this will help address the problem. Possibility that this will not correct all abdominal symptoms was explained. Goals of post-operative recovery were discussed as well. We will work to minimize complications. An educational handout further explaining the pathology and treatment options was given as well. Questions were answered. The patient expresses understanding & wishes to proceed with surgery.  Pt Education - [facility] Laparosopic Post Op HCI (Koray Soter) Pt Education - [facility] Good Bowel Health (Sasuke Yaffe) Pt Education - Laparoscopic Cholecystectomy: gallbladder ELEVATED LIVER FUNCTION TESTS (R79.89) Impression: Most likely elevated liver function tests are due to fatty change/NAFLD. He is obese but is tried intentionally losing weight. No history of viral or other types of hepatitis. Rarely drinks alcohol. Consider core liver biopsy at time of surgery. CHRONIC GERD (K21.9) Impression: Rather chronic heartburn and reflux issues. Seem to be best controlled with Nexium. However he lost availability to get coverage through his insurance. Some control omeprazole but that stopped. Not much success with Dexillant. Apparently they're declining that one as well. Hopefully can find an antacid regimen that works with gastroenterology's guidance and hopefully insurance will pay for it finally. Exhausted maximal medical therapy, consider Nissen fundoplication. We'll surgery could help with that, I hold off on that until we get him over his cholecystectomy and make sure that he is maximizes medical therapy before going down that path as well. Hopefully not very likely. We will see. 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 Most likely plan core liver biopsy. OBESITY (BMI 35.0-39.9 WITHOUT COMORBIDITY) (E66.9) PREDIABETES (R73.03)  Adin Hector, MD, FACS,  [facility] Esophageal, Gastrointestinal & Colorectal Surgery Robotic and Minimally Invasive Surgery  [facility] [address] [place], [place] [address] [phone] Fax [phone] Main/Paging  CONTACT INFORMATION: Weekday (9AM-5PM) concerns: Call [facility] main office at [phone] Weeknight (5PM-9AM) or Weekend/Holiday concerns: Check [email or web address] for General Surgery [facility] coverage (Please, do not use SecureChat as it is not reliable communication to operating surgeons for immediate patient care)

## 2020-09-02 ENCOUNTER — Other Ambulatory Visit: Payer: Self-pay | Admitting: Internal Medicine

## 2020-09-16 ENCOUNTER — Encounter (HOSPITAL_BASED_OUTPATIENT_CLINIC_OR_DEPARTMENT_OTHER): Payer: Self-pay | Admitting: Surgery

## 2020-09-16 ENCOUNTER — Other Ambulatory Visit: Payer: Self-pay

## 2020-09-16 DIAGNOSIS — I10 Essential (primary) hypertension: Secondary | ICD-10-CM

## 2020-09-16 DIAGNOSIS — R7303 Prediabetes: Secondary | ICD-10-CM

## 2020-09-16 DIAGNOSIS — Z973 Presence of spectacles and contact lenses: Secondary | ICD-10-CM

## 2020-09-16 DIAGNOSIS — K760 Fatty (change of) liver, not elsewhere classified: Secondary | ICD-10-CM

## 2020-09-16 DIAGNOSIS — K8020 Calculus of gallbladder without cholecystitis without obstruction: Secondary | ICD-10-CM

## 2020-09-16 DIAGNOSIS — M722 Plantar fascial fibromatosis: Secondary | ICD-10-CM

## 2020-09-16 HISTORY — DX: Essential (primary) hypertension: I10

## 2020-09-16 HISTORY — DX: Prediabetes: R73.03

## 2020-09-16 HISTORY — DX: Calculus of gallbladder without cholecystitis without obstruction: K80.20

## 2020-09-16 HISTORY — DX: Fatty (change of) liver, not elsewhere classified: K76.0

## 2020-09-16 HISTORY — DX: Presence of spectacles and contact lenses: Z97.3

## 2020-09-16 HISTORY — DX: Plantar fascial fibromatosis: M72.2

## 2020-09-16 NOTE — Progress Notes (Addendum)
Spoke w/ via phone for pre-op interview---pt Lab needs dos----     none          Lab results------ekg  with tracing [facility] 09-16-2020 on chart Labs done 09-16-2020 [facility] on chart: cbc with dif, cmp, lipid panel, psa, testosterone, tsh, hemaglobin a1c, hpq9, on chart, lov dr Sharilyn Sites 09-16-2020 on chart COVID test -----patient states asymptomatic no test needed Arrive at -------1145 am 09-19-2020  NPO after MN NO Solid Food.  Clear liquids from MN until---1045 am then npo Med rec completed Medications to take morning of surgery -----symbicort prn/bring symbicort, flonase nasal spray, proair inhaler prn/bring inhaler, nexium Diabetic medication -----n/a Patient instructed no nail polish to be worn day of surgery Patient instructed to bring photo id and insurance card day of surgery Patient aware to have Driver (ride ) / caregiver  wife lori   for 24 hours after surgery  Patient Special Instructions -----bring cpap mask tubing and machine Pre-Op special Istructions -----none Patient verbalized understanding of instructions that were given at this phone interview. Patient denies shortness of breath, chest pain, fever, cough at this phone interview.   Pt wishes to not pick up G 2 gatorade drink

## 2020-09-17 ENCOUNTER — Encounter (HOSPITAL_COMMUNITY): Admission: RE | Admit: 2020-09-17 | Payer: BC Managed Care – PPO | Source: Ambulatory Visit

## 2020-09-18 ENCOUNTER — Encounter (HOSPITAL_BASED_OUTPATIENT_CLINIC_OR_DEPARTMENT_OTHER): Payer: Self-pay | Admitting: Surgery

## 2020-09-19 ENCOUNTER — Ambulatory Visit (HOSPITAL_BASED_OUTPATIENT_CLINIC_OR_DEPARTMENT_OTHER)
Admission: RE | Admit: 2020-09-19 | Discharge: 2020-09-19 | Disposition: A | Discharge status: Home / Self Care | Payer: BC Managed Care – PPO | Attending: Surgery | Admitting: Surgery

## 2020-09-19 ENCOUNTER — Encounter (HOSPITAL_BASED_OUTPATIENT_CLINIC_OR_DEPARTMENT_OTHER)
Admission: RE | Disposition: A | Discharge status: Home / Self Care | Payer: Self-pay | Source: Home / Self Care | Attending: Surgery

## 2020-09-19 ENCOUNTER — Encounter (HOSPITAL_BASED_OUTPATIENT_CLINIC_OR_DEPARTMENT_OTHER): Payer: Self-pay | Admitting: Surgery

## 2020-09-19 ENCOUNTER — Ambulatory Visit (HOSPITAL_BASED_OUTPATIENT_CLINIC_OR_DEPARTMENT_OTHER): Payer: BC Managed Care – PPO | Admitting: Anesthesiology

## 2020-09-19 ENCOUNTER — Ambulatory Visit (HOSPITAL_COMMUNITY): Payer: BC Managed Care – PPO

## 2020-09-19 ENCOUNTER — Other Ambulatory Visit: Payer: Self-pay

## 2020-09-19 DIAGNOSIS — E669 Obesity, unspecified: Secondary | ICD-10-CM | POA: Diagnosis not present

## 2020-09-19 DIAGNOSIS — K7581 Nonalcoholic steatohepatitis (NASH): Secondary | ICD-10-CM | POA: Diagnosis not present

## 2020-09-19 DIAGNOSIS — Z6836 Body mass index (BMI) 36.0-36.9, adult: Secondary | ICD-10-CM | POA: Diagnosis not present

## 2020-09-19 DIAGNOSIS — K8010 Calculus of gallbladder with chronic cholecystitis without obstruction: Secondary | ICD-10-CM | POA: Insufficient documentation

## 2020-09-19 DIAGNOSIS — K8020 Calculus of gallbladder without cholecystitis without obstruction: Secondary | ICD-10-CM

## 2020-09-19 DIAGNOSIS — Z7951 Long term (current) use of inhaled steroids: Secondary | ICD-10-CM | POA: Insufficient documentation

## 2020-09-19 DIAGNOSIS — Z7952 Long term (current) use of systemic steroids: Secondary | ICD-10-CM | POA: Insufficient documentation

## 2020-09-19 DIAGNOSIS — K219 Gastro-esophageal reflux disease without esophagitis: Secondary | ICD-10-CM | POA: Diagnosis not present

## 2020-09-19 DIAGNOSIS — R7303 Prediabetes: Secondary | ICD-10-CM | POA: Insufficient documentation

## 2020-09-19 DIAGNOSIS — Z79899 Other long term (current) drug therapy: Secondary | ICD-10-CM | POA: Diagnosis not present

## 2020-09-19 DIAGNOSIS — K7400 Hepatic fibrosis, unspecified: Secondary | ICD-10-CM | POA: Diagnosis not present

## 2020-09-19 DIAGNOSIS — Z87891 Personal history of nicotine dependence: Secondary | ICD-10-CM | POA: Diagnosis not present

## 2020-09-19 HISTORY — PX: LIVER BIOPSY: SHX301

## 2020-09-19 HISTORY — PX: CHOLECYSTECTOMY: SHX55

## 2020-09-19 HISTORY — DX: Enthesopathy, unspecified: M77.9

## 2020-09-19 LAB — BASIC METABOLIC PANEL
Anion gap: 8 (ref 5–15)
BUN: 23 mg/dL — ABNORMAL HIGH (ref 6–20)
CO2: 26 mmol/L (ref 22–32)
Calcium: 9.1 mg/dL (ref 8.9–10.3)
Chloride: 109 mmol/L (ref 98–111)
Creatinine, Ser: 0.91 mg/dL (ref 0.61–1.24)
GFR, Estimated: >60 mL/min (ref >60)
Glucose, Bld: 98 mg/dL (ref 70–99)
Potassium: 3.7 mmol/L (ref 3.5–5.1)
Sodium: 143 mmol/L (ref 135–145)

## 2020-09-19 SURGERY — LAPAROSCOPIC CHOLECYSTECTOMY WITH INTRAOPERATIVE CHOLANGIOGRAM
Anesthesia: General | Site: Abdomen

## 2020-09-19 MED ORDER — ACETAMINOPHEN 500 MG PO TABS: 1000.0000 mg | ORAL_TABLET | Freq: Once | ORAL | Status: DC

## 2020-09-19 MED ORDER — CHLORHEXIDINE GLUCONATE 0.12 % MT SOLN: 15.0000 mL | Freq: Once | OROMUCOSAL | Status: DC

## 2020-09-19 MED ORDER — METRONIDAZOLE 500 MG/100ML IV SOLN
INTRAVENOUS | Status: AC
  Filled 2020-09-19: qty 100

## 2020-09-19 MED ORDER — ENSURE PRE-SURGERY PO LIQD: 296.0000 mL | Freq: Once | ORAL | Status: DC

## 2020-09-19 MED ORDER — TRAMADOL HCL 50 MG PO TABS
ORAL_TABLET | ORAL | Status: AC
  Filled 2020-09-19: qty 2

## 2020-09-19 MED ORDER — BUPIVACAINE-EPINEPHRINE 0.25% -1:200000 IJ SOLN
INTRAMUSCULAR | Status: AC
  Filled 2020-09-19: qty 1

## 2020-09-19 MED ORDER — FENTANYL CITRATE (PF) 250 MCG/5ML IJ SOLN
INTRAMUSCULAR | Status: AC
  Filled 2020-09-19: qty 5

## 2020-09-19 MED ORDER — BUPIVACAINE LIPOSOME 1.3 % IJ SUSP
INTRAMUSCULAR | Status: AC
  Filled 2020-09-19: qty 20

## 2020-09-19 MED ORDER — SODIUM CHLORIDE 0.9 % IV SOLN
2.0000 g | INTRAVENOUS | Status: AC
  Administered 2020-09-19: 2 g via INTRAVENOUS

## 2020-09-19 MED ORDER — ROCURONIUM BROMIDE 10 MG/ML (PF) SYRINGE
PREFILLED_SYRINGE | INTRAVENOUS | Status: AC
  Filled 2020-09-19: qty 10

## 2020-09-19 MED ORDER — LIDOCAINE HCL (PF) 2 % IJ SOLN
INTRAMUSCULAR | Status: AC
  Filled 2020-09-19: qty 5

## 2020-09-19 MED ORDER — PROPOFOL 10 MG/ML IV BOLUS
INTRAVENOUS | Status: AC
  Filled 2020-09-19: qty 20

## 2020-09-19 MED ORDER — BUPIVACAINE-EPINEPHRINE 0.25% -1:200000 IJ SOLN
INTRAMUSCULAR | Status: DC | PRN
  Administered 2020-09-19: 50 mL

## 2020-09-19 MED ORDER — ONDANSETRON HCL 4 MG/2ML IJ SOLN
INTRAMUSCULAR | Status: AC
  Filled 2020-09-19: qty 2

## 2020-09-19 MED ORDER — CELECOXIB 200 MG PO CAPS
ORAL_CAPSULE | ORAL | Status: AC
  Filled 2020-09-19: qty 1

## 2020-09-19 MED ORDER — TRAMADOL HCL 50 MG PO TABS: 50.0000 mg | ORAL_TABLET | Freq: Four times a day (QID) | ORAL | 0 refills | Status: AC | PRN

## 2020-09-19 MED ORDER — CELECOXIB 200 MG PO CAPS
200.0000 mg | ORAL_CAPSULE | ORAL | Status: AC
  Administered 2020-09-19: 200 mg via ORAL

## 2020-09-19 MED ORDER — GABAPENTIN 300 MG PO CAPS
300.0000 mg | ORAL_CAPSULE | ORAL | Status: AC
  Administered 2020-09-19: 300 mg via ORAL

## 2020-09-19 MED ORDER — PROPOFOL 10 MG/ML IV BOLUS
INTRAVENOUS | Status: DC | PRN
  Administered 2020-09-19: 20 mg via INTRAVENOUS
  Administered 2020-09-19: 150 mg via INTRAVENOUS
  Administered 2020-09-19: 30 mg via INTRAVENOUS

## 2020-09-19 MED ORDER — ACETAMINOPHEN 500 MG PO TABS
1000.0000 mg | ORAL_TABLET | ORAL | Status: AC
  Administered 2020-09-19: 1000 mg via ORAL

## 2020-09-19 MED ORDER — KETAMINE HCL 10 MG/ML IJ SOLN
INTRAMUSCULAR | Status: DC | PRN
  Administered 2020-09-19: 30 mg via INTRAVENOUS

## 2020-09-19 MED ORDER — DEXAMETHASONE SODIUM PHOSPHATE 10 MG/ML IJ SOLN
INTRAMUSCULAR | Status: DC | PRN
  Administered 2020-09-19: 10 mg via INTRAVENOUS

## 2020-09-19 MED ORDER — LIDOCAINE 2% (20 MG/ML) 5 ML SYRINGE
INTRAMUSCULAR | Status: DC | PRN
  Administered 2020-09-19: 100 mg via INTRAVENOUS

## 2020-09-19 MED ORDER — DEXAMETHASONE SODIUM PHOSPHATE 10 MG/ML IJ SOLN
INTRAMUSCULAR | Status: AC
  Filled 2020-09-19: qty 1

## 2020-09-19 MED ORDER — ORAL CARE MOUTH RINSE: 15.0000 mL | Freq: Once | OROMUCOSAL | Status: DC

## 2020-09-19 MED ORDER — CHLORHEXIDINE GLUCONATE CLOTH 2 % EX PADS: 6.0000 | MEDICATED_PAD | Freq: Once | CUTANEOUS | Status: DC

## 2020-09-19 MED ORDER — SODIUM CHLORIDE 0.9 % IV SOLN: INTRAVENOUS | Status: DC

## 2020-09-19 MED ORDER — IOHEXOL 300 MG/ML  SOLN
INTRAMUSCULAR | Status: DC | PRN
  Administered 2020-09-19: 10 mL

## 2020-09-19 MED ORDER — ONDANSETRON HCL 4 MG/2ML IJ SOLN
INTRAMUSCULAR | Status: DC | PRN
  Administered 2020-09-19: 4 mg via INTRAVENOUS

## 2020-09-19 MED ORDER — EPHEDRINE SULFATE-NACL 50-0.9 MG/10ML-% IV SOSY
PREFILLED_SYRINGE | INTRAVENOUS | Status: DC | PRN
  Administered 2020-09-19 (×2): 10 mg via INTRAVENOUS
  Administered 2020-09-19: 5 mg via INTRAVENOUS

## 2020-09-19 MED ORDER — FENTANYL CITRATE (PF) 100 MCG/2ML IJ SOLN
INTRAMUSCULAR | Status: DC | PRN
  Administered 2020-09-19 (×2): 50 ug via INTRAVENOUS
  Administered 2020-09-19: 100 ug via INTRAVENOUS

## 2020-09-19 MED ORDER — SUGAMMADEX SODIUM 200 MG/2ML IV SOLN
INTRAVENOUS | Status: DC | PRN
  Administered 2020-09-19: 200 mg via INTRAVENOUS

## 2020-09-19 MED ORDER — SODIUM CHLORIDE 0.9 % IV SOLN
INTRAVENOUS | Status: AC
  Filled 2020-09-19: qty 100

## 2020-09-19 MED ORDER — ROCURONIUM BROMIDE 10 MG/ML (PF) SYRINGE
PREFILLED_SYRINGE | INTRAVENOUS | Status: DC | PRN
  Administered 2020-09-19: 20 mg via INTRAVENOUS
  Administered 2020-09-19: 80 mg via INTRAVENOUS

## 2020-09-19 MED ORDER — METRONIDAZOLE 500 MG/100ML IV SOLN
500.0000 mg | INTRAVENOUS | Status: AC
  Administered 2020-09-19: 500 mg via INTRAVENOUS

## 2020-09-19 MED ORDER — GABAPENTIN 300 MG PO CAPS
ORAL_CAPSULE | ORAL | Status: AC
  Filled 2020-09-19: qty 1

## 2020-09-19 MED ORDER — BUPIVACAINE LIPOSOME 1.3 % IJ SUSP: 20.0000 mL | Freq: Once | INTRAMUSCULAR | Status: DC

## 2020-09-19 MED ORDER — CEFTRIAXONE SODIUM 2 G IJ SOLR
INTRAMUSCULAR | Status: AC
  Filled 2020-09-19: qty 20

## 2020-09-19 MED ORDER — TRAMADOL HCL 50 MG PO TABS
100.0000 mg | ORAL_TABLET | Freq: Once | ORAL | Status: AC
  Administered 2020-09-19: 100 mg via ORAL

## 2020-09-19 MED ORDER — KETAMINE HCL 50 MG/5ML IJ SOSY
PREFILLED_SYRINGE | INTRAMUSCULAR | Status: AC
  Filled 2020-09-19: qty 5

## 2020-09-19 MED ORDER — SODIUM CHLORIDE 0.9 % IR SOLN
Status: DC | PRN
  Administered 2020-09-19: 1000 mL

## 2020-09-19 MED ORDER — MIDAZOLAM HCL 2 MG/2ML IJ SOLN
INTRAMUSCULAR | Status: DC | PRN
  Administered 2020-09-19: 2 mg via INTRAVENOUS

## 2020-09-19 MED ORDER — FENTANYL CITRATE (PF) 100 MCG/2ML IJ SOLN: 25.0000 ug | INTRAMUSCULAR | Status: DC | PRN

## 2020-09-19 MED ORDER — 0.9 % SODIUM CHLORIDE (POUR BTL) OPTIME
TOPICAL | Status: DC | PRN
  Administered 2020-09-19: 1000 mL

## 2020-09-19 MED ORDER — MIDAZOLAM HCL 2 MG/2ML IJ SOLN
INTRAMUSCULAR | Status: AC
  Filled 2020-09-19: qty 2

## 2020-09-19 MED ORDER — PHENYLEPHRINE 40 MCG/ML (10ML) SYRINGE FOR IV PUSH (FOR BLOOD PRESSURE SUPPORT)
PREFILLED_SYRINGE | INTRAVENOUS | Status: DC | PRN
  Administered 2020-09-19 (×2): 120 ug via INTRAVENOUS
  Administered 2020-09-19 (×2): 80 ug via INTRAVENOUS

## 2020-09-19 MED ORDER — ACETAMINOPHEN 500 MG PO TABS
ORAL_TABLET | ORAL | Status: AC
  Filled 2020-09-19: qty 2

## 2020-09-19 MED ORDER — BUPIVACAINE LIPOSOME 1.3 % IJ SUSP
INTRAMUSCULAR | Status: DC | PRN
  Administered 2020-09-19: 20 mL

## 2020-09-19 MED ORDER — ONDANSETRON HCL 4 MG PO TABS: 4.0000 mg | ORAL_TABLET | Freq: Three times a day (TID) | ORAL | 5 refills | Status: AC | PRN

## 2020-09-19 MED ORDER — PHENYLEPHRINE 40 MCG/ML (10ML) SYRINGE FOR IV PUSH (FOR BLOOD PRESSURE SUPPORT)
PREFILLED_SYRINGE | INTRAVENOUS | Status: AC
  Filled 2020-09-19: qty 10

## 2020-09-19 MED ORDER — EPHEDRINE 5 MG/ML INJ
INTRAVENOUS | Status: AC
  Filled 2020-09-19: qty 10

## 2020-09-19 SURGICAL SUPPLY — 51 items
APPLIER CLIP 5 13 M/L LIGAMAX5 (MISCELLANEOUS) ×2
CABLE HIGH FREQUENCY MONO STRZ (ELECTRODE) ×4 IMPLANT
CHLORAPREP W/TINT 26 (MISCELLANEOUS) ×2 IMPLANT
CLIP APPLIE 5 13 M/L LIGAMAX5 (MISCELLANEOUS) ×1 IMPLANT
CNTNR URN SCR LID CUP LEK RST (MISCELLANEOUS) IMPLANT
CONT SPEC 4OZ STRL OR WHT (MISCELLANEOUS)
COVER MAYO STAND STRL (DRAPES) IMPLANT
DECANTER SPIKE VIAL GLASS SM (MISCELLANEOUS) ×2 IMPLANT
DEVICE TROCAR PUNCTURE CLOSURE (ENDOMECHANICALS) ×2 IMPLANT
DRAIN CHANNEL 19F RND (DRAIN) IMPLANT
DRAPE C-ARM 42X120 X-RAY (DRAPES) ×2 IMPLANT
DRAPE C-ARMOR (DRAPES) IMPLANT
DRAPE WARM FLUID 44X44 (DRAPES) ×2 IMPLANT
DRSG TEGADERM 4X4.75 (GAUZE/BANDAGES/DRESSINGS) ×2 IMPLANT
DRSG TELFA 3X8 NADH (GAUZE/BANDAGES/DRESSINGS) ×2 IMPLANT
ELECT REM PT RETURN 9FT ADLT (ELECTROSURGICAL) ×2
ELECTRODE REM PT RTRN 9FT ADLT (ELECTROSURGICAL) ×1 IMPLANT
ENDOLOOP SUT PDS II  0 18 (SUTURE)
ENDOLOOP SUT PDS II 0 18 (SUTURE) IMPLANT
EVACUATOR SILICONE 100CC (DRAIN) IMPLANT
GLOVE SRG 8 PF TXTR STRL LF DI (GLOVE) ×1 IMPLANT
GLOVE SURG LTX SZ8 (GLOVE) ×2 IMPLANT
GLOVE SURG UNDER POLY LF SZ8 (GLOVE) ×2
GOWN STRL REUS W/TWL XL LVL3 (GOWN DISPOSABLE) ×2 IMPLANT
IRRIG SUCT STRYKERFLOW 2 WTIP (MISCELLANEOUS) ×2
IRRIGATION SUCT STRKRFLW 2 WTP (MISCELLANEOUS) ×1 IMPLANT
KIT TURNOVER CYSTO (KITS) ×2 IMPLANT
L-HOOK LAP DISP 36CM (ELECTROSURGICAL) ×2
LHOOK LAP DISP 36CM (ELECTROSURGICAL) ×1 IMPLANT
NEEDLE BIOPSY 14X6 SOFT TISS (NEEDLE) ×2 IMPLANT
NEEDLE INSUFFLATION 120MM (ENDOMECHANICALS) ×2 IMPLANT
PACK BASIN DAY SURGERY FS (CUSTOM PROCEDURE TRAY) ×2 IMPLANT
PAD POSITIONING PINK XL (MISCELLANEOUS) ×2 IMPLANT
PENCIL BUTTON HOLSTER BLD 10FT (ELECTRODE) ×2 IMPLANT
POUCH RETRIEVAL ECOSAC 10 (ENDOMECHANICALS) ×1 IMPLANT
POUCH RETRIEVAL ECOSAC 10MM (ENDOMECHANICALS) ×2
SCISSORS LAP 5X35 DISP (ENDOMECHANICALS) ×2 IMPLANT
SET CHOLANGIOGRAPH MIX (MISCELLANEOUS) ×2 IMPLANT
SET TUBE SMOKE EVAC HIGH FLOW (TUBING) ×2 IMPLANT
SHEARS HARMONIC ACE PLUS 36CM (ENDOMECHANICALS) ×2 IMPLANT
SPONGE GAUZE 2X2 8PLY STRL LF (GAUZE/BANDAGES/DRESSINGS) ×2 IMPLANT
SUT MNCRL AB 4-0 PS2 18 (SUTURE) ×2 IMPLANT
SUT PDS AB 1 CT1 27 (SUTURE) ×4 IMPLANT
SUT VIC AB 2-0 UR6 27 (SUTURE) ×2 IMPLANT
SYR 20ML LL LF (SYRINGE) IMPLANT
TOWEL OR 17X26 10 PK STRL BLUE (TOWEL DISPOSABLE) ×2 IMPLANT
TRAY LAPAROSCOPIC (CUSTOM PROCEDURE TRAY) ×2 IMPLANT
TROCAR 5M 150ML BLDLS (TROCAR) ×2 IMPLANT
TROCAR BLADELESS OPT 5 100 (ENDOMECHANICALS) ×2 IMPLANT
TROCAR XCEL NON-BLD 11X100MML (ENDOMECHANICALS) IMPLANT
TROCAR Z-THREAD FIOS 5X100MM (TROCAR) ×2 IMPLANT

## 2020-09-19 NOTE — Op Note (Signed)
09/19/2020  PATIENT:  Samuel Campbell  45 y.o. male  Patient Care Team: Sharilyn Sites, MD as PCP - General (Family Medicine) Michael Boston, MD as Consulting Physician (General Surgery)  PRE-OPERATIVE DIAGNOSIS:    Chronic Calculus cholecystitis  POST-OPERATIVE DIAGNOSIS:   Chronic Calculus cholecystitis  Liver: Fatty steatohepatitis  PROCEDURE:  Core Liver Biopsy (CPT code 47001) & Laparoscopic cholecystectomy with intraoperative cholangiogram (CPT code [phone])  SURGEON:  Adin Hector, MD, FACS.  ASSISTANT: Lucas Mallow, MD MPH - resident   ANESTHESIA:    General with endotracheal intubation Local anesthetic as a field block  EBL:  (See Anesthesia Intraoperative Record) Total I/O In: 800 [I.V.:600; IV Piggyback:200] Out: 10 [Blood:10]   Delay start of Pharmacological VTE agent (>24hrs) due to surgical blood loss or risk of bleeding:  no  DRAINS: None   SPECIMEN: Gallbladder & Core liver biopsies    DISPOSITION OF SPECIMEN:  PATHOLOGY  COUNTS:  YES  PLAN OF CARE: Discharge to home after PACU  PATIENT DISPOSITION:  PACU - hemodynamically stable.  INDICATION:  Mr Samuel Campbell is a pleasant 45 year old male who presented with right upper quadrant pain with nausea, found to have borderline gallbladder function and possible biliary dyskinesia on workup.  The anatomy & physiology of hepatobiliary & pancreatic function was discussed.  The pathophysiology of gallbladder dysfunction was discussed.  Natural history risks without surgery was discussed.   I feel the risks of no intervention will lead to serious problems that outweigh the operative risks; therefore, I recommended cholecystectomy to remove the pathology.  I explained laparoscopic techniques with possible need for an open approach.  Probable cholangiogram to evaluate the bilary tract was explained as well.    Risks such as bleeding, infection, abscess, leak, injury to other organs, need for further treatment,  heart attack, death, and other risks were discussed.  I noted a good likelihood this will help address the problem.  Possibility that this will not correct all abdominal symptoms was explained.  Goals of post-operative recovery were discussed as well.  We will work to minimize complications.  An educational handout further explaining the pathology and treatment options was given as well.  Questions were answered.  The patient expresses understanding & wishes to proceed with surgery.  OR FINDINGS: small gallbladder with   Liver: Fatty steatohepatitis  DESCRIPTION:   Informed consent was confirmed.  The patient underwent general anaesthesia without difficulty.  The patient was positioned appropriately.  VTE prevention in place.  The patient's abdomen was clipped, prepped, & draped in a sterile fashion.  Surgical timeout confirmed our plan.  Peritoneal entry with a laparoscopic port was obtained using optical entry technique in the right upper abdomen as the patient was positioned in reverse Trendelenburg.  Entry was clean.  We induced carbon dioxide insufflation without any ensuing hemodynamic changes and.  Camera inspection revealed no injury.  An 5mm port was placed in the subxiphoid area, and two additional 5mm ports placed in the RUQ and periumbilical area.  We then turned our attention to the right upper quadrant. The gallbladder fundus was elevated cephalad.  We used the hook cautery to free the peritoneal coverings between the gallbladder and the liver on the posteriolateral and anteriomedial walls. Early on, due to the stiffness of the liver and difficulty grasping the gallbladder, it was decompressed, after which we successfully used careful blunt and hook dissection to help get a good critical view of the cystic artery and cystic duct. The cystic  artery candidate was clipped and ligated, and then we did further dissection to free 50%of the gallbladder off the liver bed to get a good critical  view of the infundibulum and cystic duct.  We further skeletonized the cystic duct and placed a clip on the infundibulum. We performed a partial cystic duct-otomy and ensured patency. Then, we placed a 5 [place] cholangiocatheter through a puncture site at the right subcostal ridge of the abdominal wall and directed it into the cystic duct.  We ran a cholangiogram with dilute radio-opaque contrast and continuous fluoroscopy. Contrast flowed from a side branch consistent with cystic duct cannulization. Contrast flowed up the common hepatic duct into the right and left intrahepatic chains out to secondary radicals. Contrast flowed down the common bile duct easily across the normal ampulla into the duodenum. The cystic duct appeared relatively small and constricted, but overall, our findings are consistent with a normal cholangiogram without evidence of biliary obstruction or choledocholithiasis.  We removed the cholangiocatheter and placed four clips on the cystic duct, and then completed the cystic duct transection. We freed the gallbladder from its remaining attachments to the liver and  ensured hemostasis on the gallbladder fossa of the liver and elsewhere. We inspected the rest of the abdomen & detected no injury nor bleeding elsewhere.  We then proceeded with three core needle biopsies of the liver using the Trucut needle under direct visualization. All biopsy sites were immediately cauterized and hemostasis was excellent.  We removed the gallbladder and we closed the subxiphoid fascia transversely using two #1 PDS interrupted stitches. We closed the skin using 4-0 monocryl stitch.  Sterile dressings were applied. The patient was extubated & arrived in the PACU in stable condition.   Lucas Mallow, MD MPH Resident, General Surgery [facility]   Adin Hector, M.D., F.A.C.S. Gastrointestinal and Minimally Invasive Surgery [facility] [address]  [place], [place] [address] [phone] Main / Paging  09/19/2020 3:24 PM

## 2020-09-19 NOTE — Anesthesia Preprocedure Evaluation (Addendum)
Anesthesia Evaluation  Patient identified by MRN, date of birth, ID band Patient awake    Reviewed: Allergy & Precautions, NPO status , Patient's Chart, lab work & pertinent test results  Airway Mallampati: I  TM Distance: >3 FB Neck ROM: Full    Dental  (+) Chipped, Dental Advisory Given,    Pulmonary asthma , sleep apnea and Continuous Positive Airway Pressure Ventilation , former smoker,    Pulmonary exam normal breath sounds clear to auscultation       Cardiovascular hypertension, Pt. on medications Normal cardiovascular exam Rhythm:Regular Rate:Normal     Neuro/Psych negative neurological ROS  negative psych ROS   GI/Hepatic Neg liver ROS, GERD  Controlled and Medicated,  Endo/Other  Obese BMI 37  Renal/GU negative Renal ROS  negative genitourinary   Musculoskeletal  (+) Arthritis ,   Abdominal   Peds  Hematology negative hematology ROS (+)   Anesthesia Other Findings   Reproductive/Obstetrics                            Anesthesia Physical Anesthesia Plan  ASA: 3  Anesthesia Plan: General   Post-op Pain Management:    Induction: Intravenous  PONV Risk Score and Plan: 2 and Midazolam, Dexamethasone and Ondansetron  Airway Management Planned: Oral ETT  Additional Equipment:   Intra-op Plan:   Post-operative Plan: Extubation in OR  Informed Consent: I have reviewed the patients History and Physical, chart, labs and discussed the procedure including the risks, benefits and alternatives for the proposed anesthesia with the patient or authorized representative who has indicated his/her understanding and acceptance.     Dental advisory given  Plan Discussed with: CRNA  Anesthesia Plan Comments:         Anesthesia Quick Evaluation

## 2020-09-19 NOTE — Transfer of Care (Signed)
Immediate Anesthesia Transfer of Care Note  Patient: Samuel Campbell  Procedure(s) Performed: Procedure(s) (LRB): LAPAROSCOPIC CHOLECYSTECTOMY WITH INTRAOPERATIVE CHOLANGIOGRAM (N/A) NEEDLE CORE BIOPSY OF LIVER (N/A)  Patient Location: PACU  Anesthesia Type: General  Level of Consciousness: awake, alert  and oriented  Airway & Oxygen Therapy: Patient Spontanous Breathing and Patient connected to nasal cannula oxygen  Post-op Assessment: Report given to PACU RN and Post -op Vital signs reviewed and stable  Post vital signs: Reviewed and stable  Complications: No apparent anesthesia complications  Last Vitals:  Vitals Value Taken Time  BP 147/80 09/19/20 1522  Temp 36.9 C 09/19/20 1522  Pulse 95 09/19/20 1529  Resp 10 09/19/20 1529  SpO2 100 % 09/19/20 1529  Vitals shown include unvalidated device data.  Last Pain:  Vitals:   09/19/20 1522  TempSrc:   PainSc: 4       Patients Stated Pain Goal: 5 (29/52/84 1324)  Complications: No notable events documented.

## 2020-09-19 NOTE — H&P (Signed)
09/19/2020      Genelle Bal DOB: 10/22/1975 Married / Language: Cleophus Molt / Race: White Male     Patient Care Team: Sharilyn Sites, MD as PCP - General (Family Medicine) Michael Boston, MD as Consulting Physician (General Surgery)  ` ` Patient sent for surgical consultation at the request of Zenovia Jarred, MD, [facility]   Chief Complaint: Right upper quadrant pain with nausea.  Borderline gallbladder function.  Possible biliary dyskinesia. ` ` The patient is a pleasant obese gentleman some struggling with intermittent abdominal pain.  He has chronic heartburn and reflux.  He is to be well controlled with Nexium.  Lost insurance coverage so he's been trying to find a PPI that'll work through gastroenterology.  Patient's had difference in his symptoms with right upper quadrant sharp pain that radiated to his back and shoulder.  Usually triggered by eating.  He is tried to adjust his diet.  Raw vegetables especially onions or trigger.  He gets nauseated and bloated.  Not quite to the point of vomiting.  An ultrasound that was negative.  Had gallbladder ejection fraction that was borderline at 36% with reproduction of symptoms with Ensure by mouth bolus.  Gastrology work to try and adjust his heartburn and reflux medicines.  Did endoscopy on him which disproves any major esophagitis and maybe mild gastritis.  H. pylori negative.  Because of his persistent symptoms now worsening despite PPI without evidence of esophagitis, surgical consultation offered.   Patient has by himself.  He is moderately active at work.  He can walk about 20 minutes before he has to stop.  He notes his bowels been somewhat irregular but no massive constipation or diarrhea.  Does not think he has irritable bowel syndrome.  He's never any abdominal surgery.  Used to smoke but quit over a decade ago.  He does have sleep apnea uses CPAP.  He was diagnosed with prediabetes.  Was on oral metformin for a while.  Now been able to be  weaned off it.  No cardiac or coronary issues that he is aware of   No personal nor family history of GI/colon cancer, inflammatory bowel disease, irritable bowel syndrome, allergy such as Celiac Sprue, dietary/dairy problems, colitis, ulcers nor gastritis.  No recent sick contacts/gastroenteritis.  No travel outside the country.  No changes in diet.  No dysphagia to solids or liquids.  No significant heartburn or reflux.  No melena, hematemesis, coffee ground emesis.  No evidence of prior gastric/peptic ulceration.  Ready or surgery   (Review of systems as stated in this history (HPI) or in the review of systems.  Otherwise all other 12 point ROS are negative) ` ` ###########################################`   This patient encounter took 30 minutes today to perform the following: obtain history, perform exam, review outside records, interpret tests & imaging, counsel the patient on their diagnosis; and, document this encounter, including findings & plan in the electronic health record (EHR).     Past Surgical History Janeann Forehand, CNA; 08/12/2020 4:02 PM) Colon Polyp Removal - Colonoscopy   Foot Surgery   Left. Oral Surgery     Diagnostic Studies History Janeann Forehand, CNA; 08/12/2020 4:02 PM) Colonoscopy   within last year   Allergies Janeann Forehand, CNA; 08/12/2020 4:02 PM) No Known Drug Allergies   [08/12/2020]: Allergies Reconciled     Medication History Janeann Forehand, CNA; 08/12/2020 4:03 PM) Virtussin A/C  (100-10MG Laurian Brim Solution, Oral) Active. Cyclobenzaprine HCl  (5MG  Tablet, Oral) Active. Dexilant  (60MG  Capsule DR, Oral)  Active. Dicyclomine HCl  (20MG  Tablet, Oral) Active. Fluticasone Propionate  (50MCG/ACT Suspension, Nasal) Active. hydroCHLOROthiazide  (25MG  Tablet, Oral) Active. Ipratropium Bromide  (0.03% Solution, Nasal) Active. Levocetirizine Dihydrochloride  (5MG  Tablet, Oral) Active. Losartan Potassium  (100MG  Tablet, Oral) Active. Montelukast Sodium  (10MG   Tablet, Oral) Active. Omeprazole  (40MG  Capsule DR, Oral) Active. predniSONE  (10MG  Tablet, Oral) Active. EQ Physiological scientist. Sutab  (1479-225-188MG  Tablet, Oral) Active. Symbicort  (80-4.5MCG/ACT Aerosol, Inhalation) Active. Medications Reconciled    Social History Janeann Forehand, CNA; 08/12/2020 4:02 PM) Alcohol use   Remotely quit alcohol use. Caffeine use   Carbonated beverages, Coffee, Tea. No drug use   Tobacco use   Former smoker.   Family History Janeann Forehand, CNA; 08/12/2020 4:02 PM) Arthritis   Mother. Colon Polyps   Brother, Father, Mother. Diabetes Mellitus   Father. Hypertension   Father, Mother.   Other Problems Janeann Forehand, CNA; 08/12/2020 4:02 PM) Arthritis   Asthma   Back Pain   Diabetes Mellitus   Gastroesophageal Reflux Disease   High blood pressure   Sleep Apnea         Review of SystemsGeneral Not Present- Appetite Loss, Chills, Fatigue, Fever, Night Sweats, Weight Gain and Weight Loss. Skin Not Present- Change in Wart/Mole, Dryness, Hives, Jaundice, New Lesions, Non-Healing Wounds, Rash and Ulcer. HEENT Present- Seasonal Allergies and Wears glasses/contact lenses. Not Present- Earache, Hearing Loss, Hoarseness, Nose Bleed, Oral Ulcers, Ringing in the Ears, Sinus Pain, Sore Throat, Visual Disturbances and Yellow Eyes. Cardiovascular Not Present- Chest Pain, Difficulty Breathing Lying Down, Leg Cramps, Palpitations, Rapid Heart Rate, Shortness of Breath and Swelling of Extremities. Gastrointestinal Present- Abdominal Pain, Bloating, Change in Bowel Habits, Gets full quickly at meals, Indigestion and Nausea. Not Present- Bloody Stool, Chronic diarrhea, Constipation, Difficulty Swallowing, Excessive gas, Hemorrhoids, Rectal Pain and Vomiting. Male Genitourinary Not Present- Blood in Urine, Change in Urinary Stream, Frequency, Impotence, Nocturia, Painful Urination, Urgency and Urine Leakage. Musculoskeletal Not Present- Back Pain, Joint  Pain, Joint Stiffness, Muscle Pain, Muscle Weakness and Swelling of Extremities. Neurological Not Present- Decreased Memory, Fainting, Headaches, Numbness, Seizures, Tingling, Tremor, Trouble walking and Weakness. Psychiatric Not Present- Anxiety, Bipolar, Change in Sleep Pattern, Depression, Fearful and Frequent crying. Endocrine Not Present- Cold Intolerance, Excessive Hunger, Hair Changes, Heat Intolerance, Hot flashes and New Diabetes. Hematology Not Present- Blood Thinners, Easy Bruising, Excessive bleeding, Gland problems, HIV and Persistent Infections.   Vitals (Donyelle Alston CNA; 08/12/2020 4:03 PM) 08/12/2020 4:03 PM Weight: 288.38 lb   Height: 73 in  Body Surface Area: 2.51 m   Body Mass Index: 38.05 kg/m   Temp.: 98.1 F    Pulse: 88 (Regular)    P.OX: 96% (Room air) BP: 140/96(Sitting, Left Arm, Standard)        09/19/2020 BP 128/89   Pulse 66   Temp 98.4 F (36.9 C) (Oral)   Resp 18   Ht 6\' 1"  (1.854 m)   Wt 127.1 kg   SpO2 98%   BMI 36.97 kg/m       Physical Exam General Mental Status - Alert. General Appearance - Not in acute distress, Not Sickly. Orientation - Oriented X3. Hydration - Well hydrated. Voice - Normal.   Integumentary Global Assessment Upon inspection and palpation of skin surfaces of the - Axillae: non-tender, no inflammation or ulceration, no drainage. and Distribution of scalp and body hair is normal. General Characteristics Temperature - normal warmth is noted.   Head and Neck Head - normocephalic, atraumatic with no  lesions or palpable masses. Face Global Assessment - atraumatic, no absence of expression. Neck Global Assessment - no abnormal movements, no bruit auscultated on the right, no bruit auscultated on the left, no decreased range of motion, non-tender. Trachea - midline. Thyroid Gland Characteristics - non-tender.   Eye Eyeball - Left - Extraocular movements intact, No Nystagmus - Left. Eyeball - Right - Extraocular  movements intact, No Nystagmus - Right. Cornea - Left - No Hazy - Left. Cornea - Right - No Hazy - Right. Sclera/Conjunctiva - Left - No scleral icterus, No Discharge - Left. Sclera/Conjunctiva - Right - No scleral icterus, No Discharge - Right. Pupil - Left - Direct reaction to light normal. Pupil - Right - Direct reaction to light normal.   ENMT Ears Pinna - Left - no drainage observed, no generalized tenderness observed. Pinna - Right - no drainage observed, no generalized tenderness observed. Nose and Sinuses External Inspection of the Nose - no destructive lesion observed. Inspection of the nares - Left - quiet respiration. Inspection of the nares - Right - quiet respiration. Mouth and Throat Lips - Upper Lip - no fissures observed, no pallor noted. Lower Lip - no fissures observed, no pallor noted. Nasopharynx - no discharge present. Oral Cavity/Oropharynx - Tongue - no dryness observed. Oral Mucosa - no cyanosis observed. Hypopharynx - no evidence of airway distress observed.   Chest and Lung Exam Inspection Movements - Normal and Symmetrical. Accessory muscles - No use of accessory muscles in breathing. Palpation Palpation of the chest reveals - Non-tender. Auscultation Breath sounds - Normal and Clear.   Cardiovascular Auscultation Rhythm - Regular. Murmurs & Other Heart Sounds - Auscultation of the heart reveals - No Murmurs and No Systolic Clicks.   Abdomen Inspection Inspection of the abdomen reveals - No Visible peristalsis and No Abnormal pulsations. Umbilicus - No Bleeding, No Urine drainage. Palpation/Percussion Palpation and Percussion of the abdomen reveal - Soft, Non Tender, No Rebound tenderness, No Rigidity (guarding) and No Cutaneous hyperesthesia. Note:  Right upper quadrant pain with sensitivity.  Not quite a major Murphy sign.  Minimal epigastric discomfort.  The rest the abdomen is nontender.   Abdomen soft.  Nontender.  Not distended.  Mild suprapubic  umbilical diastases. Upper abdomen rather thin. Bellybutton is sensitive but no obvious hernia.  No umbilical or incisional hernias.  No guarding.     Male Genitourinary Sexual Maturity Tanner 5 - Adult hair pattern and Adult penile size and shape.   Peripheral Vascular Upper Extremity Inspection - Left - No Cyanotic nailbeds - Left, Not Ischemic. Inspection - Right - No Cyanotic nailbeds - Right, Not Ischemic.   Neurologic Neurologic evaluation reveals  - normal attention span and ability to concentrate, able to name objects and repeat phrases. Appropriate fund of knowledge , normal sensation and normal coordination. Mental Status Affect - not angry, not paranoid. Cranial Nerves - Normal Bilaterally. Gait - Normal.   Neuropsychiatric Mental status exam performed with findings of - able to articulate well with normal speech/language, rate, volume and coherence, thought content normal with ability to perform basic computations and apply abstract reasoning and no evidence of hallucinations, delusions, obsessions or homicidal/suicidal ideation.   Musculoskeletal Global Assessment Spine, Ribs and Pelvis - no instability, subluxation or laxity. Right Upper Extremity - no instability, subluxation or laxity.   Lymphatic Head & Neck   General Head & Neck Lymphatics: Bilateral - Description - No Localized lymphadenopathy. Axillary   General Axillary Region: Bilateral - Description - No  Localized lymphadenopathy. Femoral & Inguinal   Generalized Femoral & Inguinal Lymphatics: Left - Description - No Localized lymphadenopathy. Right - Description - No Localized lymphadenopathy.       Assessment & PlanCHRONIC CHOLECYSTITIS WITHOUT CALCULUS (K81.1) Impression: Rather classic story biliary colic. Risks of the differential diagnosis is underwhelming. Reproduction of symptoms with Ensure bolus with borderline gallbladder function. I think he's exhausted other options. I think he would benefit  from cholecystectomy. Reasonable to start out single site. May need to do more incisions given his obesity. We will see. Low threshold to do core liver biopsy of liver looks abnormal bowel movement documented make sure there is no other concerning etiologies for his pain.   I did caution that success rate is less than people with gallstones. However he's exhausted other options and he's had a rather thorough workup.   The anatomy & physiology of hepatobiliary & pancreatic function was discussed.  The pathophysiology of gallbladder dysfunction was discussed.  Natural history risks without surgery was discussed.   I feel the risks of no intervention will lead to serious problems that outweigh the operative risks; therefore, I recommended cholecystectomy to remove the pathology.  I explained laparoscopic techniques with possible need for an open approach.  Probable cholangiogram to evaluate the bilary tract was explained as well.    Risks such as bleeding, infection, abscess, leak, injury to other organs, need for further treatment, heart attack, death, and other risks were discussed.  I noted a good likelihood this will help address the problem.  Possibility that this will not correct all abdominal symptoms was explained.  Goals of post-operative recovery were discussed as well.  We will work to minimize complications.  An educational handout further explaining the pathology and treatment options was given as well.  Questions were answered.  The patient expresses understanding & wishes to proceed with surgery.    ELEVATED LIVER FUNCTION TESTS (R79.89) Impression: Most likely elevated liver function tests are due to fatty change/NAFLD. He is obese but is tried intentionally losing weight. No history of viral or other types of hepatitis. Rarely drinks alcohol. Consider core liver biopsy at time of surgery.  CHRONIC GERD (K21.9) Impression: Rather chronic heartburn and reflux issues. Seem to be best  controlled with Nexium. However he lost availability to get coverage through his insurance. Some control omeprazole but that stopped. Not much success with Dexillant. Apparently they're declining that one as well. Hopefully can find an antacid regimen that works with gastroenterology's guidance and hopefully insurance will pay for it finally. Exhausted maximal medical therapy, consider Nissen fundoplication. We'll surgery could help with that, I hold off on that until we get him over his cholecystectomy and make sure that he is maximizes medical therapy before going down that path as well. Hopefully not very likely. We will see.  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 Most likely plan core liver biopsy. OBESITY (BMI 35.0-39.9 WITHOUT COMORBIDITY) (E66.9) PREDIABETES (R73.03)   Adin Hector, MD, FACS, MASCRS Esophageal, Gastrointestinal & Colorectal Surgery Robotic and Minimally Invasive Surgery  [facility], [place]  [place] [place], [place] [address] [phone] Fax [phone] Main  CONTACT INFORMATION:  Weekday (9AM-5PM): Call [facility] main office at [phone]  Weeknight (5PM-9AM) or Weekend/Holiday: Check [email or web address] (password " [email or web address]") for General Surgery [facility] coverage  (Please, do not use SecureChat as it is not reliable communication to operating surgeons for  immediate patient care)

## 2020-09-19 NOTE — Discharge Instructions (Addendum)
LAPAROSCOPIC SURGERY: POST OP INSTRUCTIONS  ######################################################################  EAT Gradually transition to a high fiber diet with a fiber supplement over the next few weeks after discharge.  Start with a pureed / full liquid diet (see below)  WALK Walk an hour a day.  Control your pain to do that.    CONTROL PAIN Control pain so that you can walk, sleep, tolerate sneezing/coughing, go up/down stairs.  HAVE A BOWEL MOVEMENT DAILY Keep your bowels regular to avoid problems.  OK to try a laxative to override constipation.  OK to use an antidairrheal to slow down diarrhea.  Call if not better after 2 tries  CALL IF YOU HAVE PROBLEMS/CONCERNS Call if you are still struggling despite following these instructions. Call if you have concerns not answered by these instructions  ######################################################################    DIET: Follow a light bland diet & liquids the first 24 hours after arrival home, such as soup, liquids, starches, etc.  Be sure to drink plenty of fluids.  Quickly advance to a usual solid diet within a few days.  Avoid fast food or heavy meals as your are more likely to get nauseated or have irregular bowels.  A low-fat, high-fiber diet for the rest of your life is ideal.  Take your usually prescribed home medications unless otherwise directed.  PAIN CONTROL: Pain is best controlled by a usual combination of three different methods TOGETHER: Ice/Heat Over the counter pain medication Prescription pain medication Most patients will experience some swelling and bruising around the incisions.  Ice packs or heating pads (30-60 minutes up to 6 times a day) will help. Use ice for the first few days to help decrease swelling and bruising, then switch to heat to help relax tight/sore spots and speed recovery.  Some people prefer to use ice alone, heat alone, alternating between ice & heat.  Experiment to what works for  you.  Swelling and bruising can take several weeks to resolve.   It is helpful to take an over-the-counter pain medication regularly for the first few weeks.  Choose one of the following that works best for you: Naproxen (Aleve, etc)  Two 220mg  tabs twice a day  **MAY BEGIN TAKING AT 6:30PM TONIGHT IF NEEDED FOR PAIN. Ibuprofen (Advil, etc) Three 200mg  tabs four times a day (every meal & bedtime)  **MAY BEGIN TAKING AT 6:30PM TONIGHT IF NEEDED FOR PAIN. Acetaminophen (Tylenol, etc) 500-650mg  four times a day (every meal & bedtime)  **MAY BEGIN TAKING AT 6:30PM TONIGHT IF NEEDED FOR PAIN. A  prescription for pain medication (such as oxycodone, hydrocodone, tramadol, gabapentin, methocarbamol, etc) should be given to you upon discharge.  Take your pain medication as prescribed.  If you are having problems/concerns with the prescription medicine (does not control pain, nausea, vomiting, rash, itching, etc), please call us [phone] to see if we need to switch you to a different pain medicine that will work better for you and/or control your side effect better. If you need a refill on your pain medication, please give [place] 48 hour notice.  contact your pharmacy.  They will contact our office to request authorization. Prescriptions will not be filled after 5 pm or on week-ends  Avoid getting constipated.   Between the surgery and the pain medications, it is common to experience some constipation.   Increasing fluid intake and taking a fiber supplement (such as Metamucil, Citrucel, FiberCon, MiraLax, etc) 1-2 times a day regularly will usually help prevent this problem from occurring.   A  mild laxative (prune juice, Milk of Magnesia, MiraLax, etc) should be taken according to package directions if there are no bowel movements after 48 hours.   Watch out for diarrhea.   If you have many loose bowel movements, simplify your diet to bland foods & liquids for a few days.   Stop any stool softeners and  decrease your fiber supplement.   Switching to mild anti-diarrheal medications (Kayopectate, Pepto Bismol) can help.   If this worsens or does not improve, please call us.  Wash / shower every day.  You may shower over the dressings as they are waterproof.  Continue to shower over incision(s) after the dressing is off.  REMOVE ALL DRESSINGS: Remove your waterproof bandages (tegaderm clear band-aids, steristrip skin tapes, etc) THREE DAYS AFTER SURGERY.  You may leave the incisions open to air.  You may replace a dressing/Band-Aid to cover the incision for comfort if you wish.   ACTIVITIES as tolerated:   You may resume regular (light) daily activities beginning the next day--such as daily self-care, walking, climbing stairs--gradually increasing activities as tolerated.  If you can walk 30 minutes without difficulty, it is safe to try more intense activity such as jogging, treadmill, bicycling, low-impact aerobics, swimming, etc. Save the most intensive and strenuous activity for last such as sit-ups, heavy lifting, contact sports, etc  Refrain from any heavy lifting or straining until you are off narcotics for pain control.   DO NOT PUSH THROUGH PAIN.  Let pain be your guide: If it hurts to do something, don't do it.  Pain is your body warning you to avoid that activity for another week until the pain goes down. You may drive when you are no longer taking prescription pain medication, you can comfortably wear a seatbelt, and you can safely maneuver your car and apply brakes. You may have sexual intercourse when it is comfortable.  FOLLOW UP in our office Please call [facility] at [phone] to set up an appointment to see your surgeon in the office for a follow-up appointment approximately 2-3 weeks after your surgery. Make sure that you call for this appointment the day you arrive home to insure a convenient appointment time.  10. IF YOU HAVE DISABILITY OR FAMILY LEAVE FORMS, BRING THEM TO THE  OFFICE FOR PROCESSING.  DO NOT GIVE THEM TO YOUR DOCTOR.   WHEN TO CALL us [phone]: Poor pain control Reactions / problems with new medications (rash/itching, nausea, etc)  Fever over 101.5 F (38.5 C) Inability to urinate Nausea and/or vomiting Worsening swelling or bruising Continued bleeding from incision. Increased pain, redness, or drainage from the incision   The clinic staff is available to answer your questions during regular business hours (8:30am-5pm).  Please don't hesitate to call and ask to speak to one of our nurses for clinical concerns.   If you have a medical emergency, go to the nearest emergency room or call 911.  A surgeon from [facility] is always on call at the [facility], [place], [place], [place], [place]  [address] ? MAIN: [phone] ? TOLL FREE: [phone] ?  FAX [phone] [email or web address]    Post Anesthesia Home Care Instructions  Activity: Get plenty of rest for the remainder of the day. A responsible individual must stay with you for 24 hours following the procedure.  For the next 24 hours, DO NOT: -Drive a car -Paediatric nurse -Drink alcoholic beverages -Take any medication unless  instructed by your physician -Make any legal decisions or sign important papers.  Meals: Start with liquid foods such as gelatin or soup. Progress to regular foods as tolerated. Avoid greasy, spicy, heavy foods. If nausea and/or vomiting occur, drink only clear liquids until the nausea and/or vomiting subsides. Call your physician if vomiting continues.  Special Instructions/Symptoms: Your throat may feel dry or sore from the anesthesia or the breathing tube placed in your throat during surgery. If this causes discomfort, gargle with warm salt water. The discomfort should disappear within 24 hours.    Information for Discharge Teaching: EXPAREL (bupivacaine liposome injectable  suspension)   Your surgeon or anesthesiologist gave you EXPAREL(bupivacaine) to help control your pain after surgery.  EXPAREL is a local anesthetic that provides pain relief by numbing the tissue around the surgical site. EXPAREL is designed to release pain medication over time and can control pain for up to 72 hours. Depending on how you respond to EXPAREL, you may require less pain medication during your recovery.  Possible side effects: Temporary loss of sensation or ability to move in the area where bupivacaine was injected. Nausea, vomiting, constipation Rarely, numbness and tingling in your mouth or lips, lightheadedness, or anxiety may occur. Call your doctor right away if you think you may be experiencing any of these sensations, or if you have other questions regarding possible side effects.  Follow all other discharge instructions given to you by your surgeon or nurse. Eat a healthy diet and drink plenty of water or other fluids.  If you return to the hospital for any reason within 96 hours following the administration of EXPAREL, it is important for health care providers to know that you have received this anesthetic. A teal colored band has been placed on your arm with the date, time and amount of EXPAREL you have received in order to alert and inform your health care providers. Please leave this armband in place for the full 96 hours following administration, and then you may remove the band.

## 2020-09-19 NOTE — Anesthesia Procedure Notes (Signed)
Procedure Name: Intubation Date/Time: 09/19/2020 1:35 PM Performed by: Mechele Claude, CRNA Pre-anesthesia Checklist: Patient identified, Emergency Drugs available, Suction available and Patient being monitored Patient Re-evaluated:Patient Re-evaluated prior to induction Oxygen Delivery Method: Circle system utilized Preoxygenation: Pre-oxygenation with 100% oxygen Induction Type: IV induction Ventilation: Mask ventilation with difficulty, Oral airway inserted - appropriate to patient size and Two handed mask ventilation required Laryngoscope Size: Glidescope and 4 Grade View: Grade III Tube type: Oral Tube size: 7.5 mm Number of attempts: 2 Airway Equipment and Method: Oral airway and Rigid stylet Placement Confirmation: ETT inserted through vocal cords under direct vision, positive ETCO2 and breath sounds checked- equal and bilateral Secured at: 23 cm Tube secured with: Tape Dental Injury: Teeth and Oropharynx as per pre-operative assessment  Difficulty Due To: Difficulty was unanticipated Comments: DLx1 with MAC 4 blade.Able to only visualize arytenoids briefly then lost view. DLx2 with Glidescope S4, AOI under direct view.Large neck and redunant tissue in the oropharynx made visualization difficult.

## 2020-09-20 ENCOUNTER — Encounter (HOSPITAL_BASED_OUTPATIENT_CLINIC_OR_DEPARTMENT_OTHER): Payer: Self-pay | Admitting: Surgery

## 2020-09-20 LAB — SURGICAL PATHOLOGY

## 2020-09-20 NOTE — Anesthesia Postprocedure Evaluation (Signed)
Anesthesia Post Note  Patient: UGONNA KEIRSEY  Procedure(s) Performed: LAPAROSCOPIC CHOLECYSTECTOMY WITH INTRAOPERATIVE CHOLANGIOGRAM (Abdomen) NEEDLE CORE BIOPSY OF LIVER (Abdomen)     Patient location during evaluation: PACU Anesthesia Type: General Level of consciousness: awake and alert Pain management: pain level controlled Vital Signs Assessment: post-procedure vital signs reviewed and stable Respiratory status: spontaneous breathing, nonlabored ventilation, respiratory function stable and patient connected to nasal cannula oxygen Cardiovascular status: blood pressure returned to baseline and stable Postop Assessment: no apparent nausea or vomiting Anesthetic complications: no   No notable events documented.  Last Vitals:  Vitals:   09/19/20 1545 09/19/20 1615  BP: (!) 164/89 (!) 154/101  Pulse: 74 77  Resp: 17 16  Temp: 36.6 C 36.6 C  SpO2: 100% 98%    Last Pain:  Vitals:   09/19/20 1615  TempSrc:   PainSc: 6                  Barnet Glasgow

## 2020-10-01 ENCOUNTER — Encounter (HOSPITAL_BASED_OUTPATIENT_CLINIC_OR_DEPARTMENT_OTHER): Payer: Self-pay | Admitting: Surgery

## 2020-10-01 NOTE — Addendum Note (Signed)
Addendum  created 10/01/20 0921 by Freddrick March, MD   Intraprocedure Event edited, Intraprocedure Meds edited, Intraprocedure Staff edited

## 2023-07-28 ENCOUNTER — Ambulatory Visit (HOSPITAL_BASED_OUTPATIENT_CLINIC_OR_DEPARTMENT_OTHER)

## 2023-07-28 ENCOUNTER — Other Ambulatory Visit (HOSPITAL_BASED_OUTPATIENT_CLINIC_OR_DEPARTMENT_OTHER): Payer: Self-pay

## 2023-07-28 ENCOUNTER — Ambulatory Visit (INDEPENDENT_AMBULATORY_CARE_PROVIDER_SITE_OTHER): Payer: Self-pay | Admitting: Orthopaedic Surgery

## 2023-07-28 DIAGNOSIS — M25551 Pain in right hip: Secondary | ICD-10-CM

## 2023-07-28 NOTE — Progress Notes (Signed)
 Chief Complaint: Right hip pain     History of Present Illness:    Samuel Campbell is a 48 y.o. male presents today with ongoing right hip pain which does radiate to the buttocks as well as to the front and lateral aspect of the hip.  Denies any injury.  He has having pain over the last several months which is worse with activity or even with sitting for longer periods of time.  He has trialed anti-inflammatories.  He has not had any injections in the past.    PMH/PSH/Family History/Social History/Meds/Allergies:    Past Medical History:  Diagnosis Date   Arthritis    Asthma    COVID 10/04/2019   sob, sinus issues, weaknessx 4 days all symptoms resolved   Elevated LFTs    Fatty liver 09/16/2020   GERD (gastroesophageal reflux disease)    Hepatic steatosis    High blood pressure 09/16/2020   Obesity    Plantar fascial fibromatosis of right foot 09/16/2020   wears insert   Pre-diabetes 09/16/2020   early dm per dr Minus Amel lov 09-16-2020   Sleep apnea    uses cpap does not know settings   Symptomatic cholelithiasis 09/16/2020   no recent symptoms per pt   Tendinitis    wears boot on left ankle when sleeping   Wears glasses 09/16/2020   Past Surgical History:  Procedure Laterality Date   ANKLE SURGERY Left 02/11/2018   CARPAL TUNNEL RELEASE Right    2006 or 2007 or 2008   CHOLECYSTECTOMY N/A 09/19/2020   Procedure: LAPAROSCOPIC CHOLECYSTECTOMY WITH INTRAOPERATIVE CHOLANGIOGRAM;  Surgeon: Candyce Champagne, MD;  Location[facility];  Service: General;  Laterality: N/A;   FOOT SURGERY Left 02/11/2018   for plantar fascilits   LIVER BIOPSY N/A 09/19/2020   Procedure: NEEDLE CORE BIOPSY OF LIVER;  Surgeon: Candyce Champagne, MD;  Location: [facility] [place];  Service: General;  Laterality: N/A;   TONSILLECTOMY     age 55   WISDOM TOOTH EXTRACTION     age 83   Social History   Socioeconomic History   Marital status: Married    Spouse name: Not  on file   Number of children: Not on file   Years of education: Not on file   Highest education level: Not on file  Occupational History   Not on file  Tobacco Use   Smoking status: Former    Current packs/day: 0.00    Average packs/day: 1.5 packs/day for 15.0 years (22.5 ttl pk-yrs)    Types: Cigarettes    Start date: 08/21/1986    Quit date: 08/20/2001    Years since quitting: 21.9   Smokeless tobacco: Former    Quit date: 08/20/2001  Vaping Use   Vaping status: Never Used  Substance and Sexual Activity   Alcohol use: Not Currently   Drug use: Never   Sexual activity: Not Currently  Other Topics Concern   Not on file  Social History Narrative   Not on file   Social Drivers of Health   Financial Resource Strain: Not on file  Food Insecurity: Not on file  Transportation Needs: Not on file  Physical Activity: Not on file  Stress: Not on file  Social Connections: Not on file   Family History  Problem Relation Age of Onset   Hypertension Mother    Celiac disease Mother    Colon polyps Mother    Diabetes Father    Alzheimer's disease Maternal  Grandmother    COPD Maternal Grandfather    Asthma Maternal Grandfather    Diabetes Paternal Grandmother    Heart disease Paternal Grandmother    Diabetes Paternal Grandfather    Cirrhosis Other        Great uncle   Colon cancer Neg Hx    Stomach cancer Neg Hx    Esophageal cancer Neg Hx    Pancreatic cancer Neg Hx    No Known Allergies Current Outpatient Medications  Medication Sig Dispense Refill   aspirin 81 MG chewable tablet Chew 81 mg by mouth daily.     budesonide-formoterol (SYMBICORT) 80-4.5 MCG/ACT inhaler Inhale 2 puffs into the lungs as needed.     Cholecalciferol (VITAMIN D3 PO) Take 1 tablet by mouth daily.     dexlansoprazole  (DEXILANT ) 60 MG capsule Take 1 capsule (60 mg total) by mouth daily. 90 capsule 3   dicyclomine  (BENTYL ) 20 MG tablet TAKE 1 TABLET BY MOUTH 4 TIMES DAILY BEFORE MEAL(S) AND AT BEDTIME  (Patient taking differently: as needed.) 90 tablet 0   ELDERBERRY PO Take 1 tablet by mouth daily.     esomeprazole (NEXIUM) 20 MG packet Take 20 mg by mouth daily before breakfast.     fluticasone (FLONASE) 50 MCG/ACT nasal spray Place 1 spray into both nostrils 2 (two) times daily.     hydrochlorothiazide (HYDRODIURIL) 25 MG tablet Take 25 mg by mouth daily.     ipratropium (ATROVENT) 0.03 % nasal spray Place 1 spray into both nostrils as needed.  5   levocetirizine (XYZAL) 5 MG tablet Take 5 mg by mouth every evening.     losartan (COZAAR) 100 MG tablet Take 100 mg by mouth daily.     montelukast (SINGULAIR) 10 MG tablet Take 10 mg by mouth at bedtime.     Olopatadine HCl 0.2 % SOLN Place 1 drop into both eyes once a week. Pataday per pt on Monday and prn when takes contacts out     ondansetron  (ZOFRAN ) 4 MG tablet Take 1 tablet (4 mg total) by mouth every 8 (eight) hours as needed for nausea. 8 tablet 5   PROAIR HFA 108 (90 Base) MCG/ACT inhaler Inhale 2 puffs into the lungs as needed. Albuterol  2   traMADol  (ULTRAM ) 50 MG tablet Take 1-2 tablets (50-100 mg total) by mouth every 6 (six) hours as needed for moderate pain or severe pain. 20 tablet 0   vitamin C (ASCORBIC ACID) 500 MG tablet Take 1 tablet by mouth daily.     vitamin E 180 MG (400 UNITS) capsule Take 800 Units by mouth daily.     No current facility-administered medications for this visit.   No results found.  Review of Systems:   A ROS was performed including pertinent positives and negatives as documented in the HPI.  Physical Exam :   Constitutional: NAD and appears stated age Neurological: Alert and oriented Psych: Appropriate affect and cooperative There were no vitals taken for this visit.   Comprehensive Musculoskeletal Exam:    Right hip with tenderness about the femoral acetabular joint with positive FADIR and 30 degrees internal rotation with 30 degrees external rotation relieving the pain.  Distal  neurosensory exam is intact.  No weakness with hip abduction and normal gait   Imaging:   Xray (4 views right hip): Normal   I personally reviewed and interpreted the radiographs.   Assessment and Plan:   48 y.o. 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  That being said I would like to start with a diagnostic injection of the right femoral acetabular joint so we can see how much of his hip type pain and groin pain this improves.  I will plan to see him back in 6 weeks for reassessment   I personally saw and evaluated the patient, and participated in the management and treatment plan.  Wilhelmenia Harada, MD Attending Physician, Orthopedic Surgery  This document was dictated using Dragon voice recognition software. A reasonable attempt at proof reading has been made to minimize errors.

## 2023-08-26 ENCOUNTER — Telehealth: Payer: Self-pay | Admitting: Orthopaedic Surgery

## 2023-08-26 DIAGNOSIS — M25551 Pain in right hip: Secondary | ICD-10-CM

## 2023-08-26 NOTE — Telephone Encounter (Signed)
 Called and advised pt.

## 2023-08-26 NOTE — Telephone Encounter (Signed)
 Mri ordered

## 2023-08-26 NOTE — Telephone Encounter (Signed)
 Patient called and wants to know if he can go ahead and send for the MRI because his pain is still there. CB#[phone]

## 2023-08-26 NOTE — Addendum Note (Signed)
 Addended by: Catherene Close on: 08/26/2023 04:42 PM   Modules accepted: Orders

## 2023-08-28 ENCOUNTER — Ambulatory Visit
Admission: RE | Admit: 2023-08-28 | Discharge: 2023-08-28 | Disposition: A | Discharge status: Home / Self Care | Source: Ambulatory Visit | Attending: Orthopaedic Surgery | Admitting: Orthopaedic Surgery

## 2023-08-28 DIAGNOSIS — M25551 Pain in right hip: Secondary | ICD-10-CM

## 2023-09-02 ENCOUNTER — Other Ambulatory Visit

## 2023-09-08 ENCOUNTER — Ambulatory Visit (HOSPITAL_BASED_OUTPATIENT_CLINIC_OR_DEPARTMENT_OTHER): Admitting: Orthopaedic Surgery

## 2023-09-08 DIAGNOSIS — M25551 Pain in right hip: Secondary | ICD-10-CM | POA: Diagnosis not present

## 2023-09-08 MED ORDER — LIDOCAINE HCL 1 % IJ SOLN
4.0000 mL | INTRAMUSCULAR | Status: AC | PRN
  Administered 2023-09-08: 4 mL

## 2023-09-08 MED ORDER — TRIAMCINOLONE ACETONIDE 40 MG/ML IJ SUSP
80.0000 mg | INTRAMUSCULAR | Status: AC | PRN
  Administered 2023-09-08: 80 mg via INTRA_ARTICULAR

## 2023-09-08 NOTE — Progress Notes (Signed)
 Chief Complaint: Right hip pain     History of Present Illness:   09/08/2023: Presents today for further MRI discussion of the right hip.  He did get 1-1/2 weeks of extremely good relief.  Samuel Campbell is a 48 y.o. male presents today with ongoing right hip pain which does radiate to the buttocks as well as to the front and lateral aspect of the hip.  Denies any injury.  He has having pain over the last several months which is worse with activity or even with sitting for longer periods of time.  He has trialed anti-inflammatories.  He has not had any injections in the past.    PMH/PSH/Family History/Social History/Meds/Allergies:    Past Medical History:  Diagnosis Date  . Arthritis   . Asthma   . COVID 10/04/2019   sob, sinus issues, weaknessx 4 days all symptoms resolved  . Elevated LFTs   . Fatty liver 09/16/2020  . GERD (gastroesophageal reflux disease)   . Hepatic steatosis   . High blood pressure 09/16/2020  . Obesity   . Plantar fascial fibromatosis of right foot 09/16/2020   wears insert  . Pre-diabetes 09/16/2020   early dm per dr norleen general lov 09-16-2020  . Sleep apnea    uses cpap does not know settings  . Symptomatic cholelithiasis 09/16/2020   no recent symptoms per pt  . Tendinitis    wears boot on left ankle when sleeping  . Wears glasses 09/16/2020   Past Surgical History:  Procedure Laterality Date  . ANKLE SURGERY Left 02/11/2018  . CARPAL TUNNEL RELEASE Right    2006 or 2007 or 2008  . CHOLECYSTECTOMY N/A 09/19/2020   Procedure: LAPAROSCOPIC CHOLECYSTECTOMY WITH INTRAOPERATIVE CHOLANGIOGRAM;  Surgeon: Sheldon Standing, MD;  [facility]: [facility];  Service: General;  Laterality: N/A;  . FOOT SURGERY Left 02/11/2018   for plantar fascilits  . LIVER BIOPSY N/A 09/19/2020   Procedure: NEEDLE CORE BIOPSY OF LIVER;  Surgeon: Sheldon Standing, MD;  Location: [facility] [place];  Service: General;  Laterality: N/A;  . TONSILLECTOMY      age 36  . WISDOM TOOTH EXTRACTION     age 31   Social History   Socioeconomic History  . Marital status: Married    Spouse name: Not on file  . Number of children: Not on file  . Years of education: Not on file  . Highest education level: Not on file  Occupational History  . Not on file  Tobacco Use  . Smoking status: Former    Current packs/day: 0.00    Average packs/day: 1.5 packs/day for 15.0 years (22.5 ttl pk-yrs)    Types: Cigarettes    Start date: 08/21/1986    Quit date: 08/20/2001    Years since quitting: 22.0  . Smokeless tobacco: Former    Quit date: 08/20/2001  Vaping Use  . Vaping status: Never Used  Substance and Sexual Activity  . Alcohol use: Not Currently  . Drug use: Never  . Sexual activity: Not Currently  Other Topics Concern  . Not on file  Social History Narrative  . Not on file   Social Drivers of Health   Financial Resource Strain: Not on file  Food Insecurity: Not on file  Transportation Needs: Not on file  Physical Activity: Not on file  Stress: Not on file  Social Connections: Not on file   Family History  Problem Relation Age of Onset  . Hypertension Mother   .  Celiac disease Mother   . Colon polyps Mother   . Diabetes Father   . Alzheimer's disease Maternal Grandmother   . COPD Maternal Grandfather   . Asthma Maternal Grandfather   . Diabetes Paternal Grandmother   . Heart disease Paternal Grandmother   . Diabetes Paternal Grandfather   . Cirrhosis Other        Great uncle  . Colon cancer Neg Hx   . Stomach cancer Neg Hx   . Esophageal cancer Neg Hx   . Pancreatic cancer Neg Hx    No Known Allergies Current Outpatient Medications  Medication Sig Dispense Refill  . aspirin 81 MG chewable tablet Chew 81 mg by mouth daily.    . budesonide-formoterol (SYMBICORT) 80-4.5 MCG/ACT inhaler Inhale 2 puffs into the lungs as needed.    . Cholecalciferol (VITAMIN D3 PO) Take 1 tablet by mouth daily.    . dexlansoprazole  (DEXILANT ) 60  MG capsule Take 1 capsule (60 mg total) by mouth daily. 90 capsule 3  . dicyclomine  (BENTYL ) 20 MG tablet TAKE 1 TABLET BY MOUTH 4 TIMES DAILY BEFORE MEAL(S) AND AT BEDTIME (Patient taking differently: as needed.) 90 tablet 0  . ELDERBERRY PO Take 1 tablet by mouth daily.    SABRA esomeprazole (NEXIUM) 20 MG packet Take 20 mg by mouth daily before breakfast.    . fluticasone (FLONASE) 50 MCG/ACT nasal spray Place 1 spray into both nostrils 2 (two) times daily.    . hydrochlorothiazide (HYDRODIURIL) 25 MG tablet Take 25 mg by mouth daily.    SABRA ipratropium (ATROVENT) 0.03 % nasal spray Place 1 spray into both nostrils as needed.  5  . levocetirizine (XYZAL) 5 MG tablet Take 5 mg by mouth every evening.    SABRA losartan (COZAAR) 100 MG tablet Take 100 mg by mouth daily.    . montelukast (SINGULAIR) 10 MG tablet Take 10 mg by mouth at bedtime.    . Olopatadine HCl 0.2 % SOLN Place 1 drop into both eyes once a week. Pataday per pt on Monday and prn when takes contacts out    . ondansetron  (ZOFRAN ) 4 MG tablet Take 1 tablet (4 mg total) by mouth every 8 (eight) hours as needed for nausea. 8 tablet 5  . PROAIR HFA 108 (90 Base) MCG/ACT inhaler Inhale 2 puffs into the lungs as needed. Albuterol  2  . traMADol  (ULTRAM ) 50 MG tablet Take 1-2 tablets (50-100 mg total) by mouth every 6 (six) hours as needed for moderate pain or severe pain. 20 tablet 0  . vitamin C (ASCORBIC ACID) 500 MG tablet Take 1 tablet by mouth daily.    . vitamin E 180 MG (400 UNITS) capsule Take 800 Units by mouth daily.     No current facility-administered medications for this visit.   No results found.  Review of Systems:   A ROS was performed including pertinent positives and negatives as documented in the HPI.  Physical Exam :   Constitutional: NAD and appears stated age Neurological: Alert and oriented Psych: Appropriate affect and cooperative There were no vitals taken for this visit.   Comprehensive Musculoskeletal Exam:     Right hip with tenderness about the femoral acetabular joint with positive FADIR and 30 degrees internal rotation with 30 degrees external rotation relieving the pain.  Distal neurosensory exam is intact.  No weakness with hip abduction and normal gait   Imaging:   Xray (4 views right hip): Normal  MRI right hip: Anterior superior labral tear  without evidence of arthritis or chondral loss  I personally reviewed and interpreted the radiographs.   Assessment and Plan:   47 y.o. male with right hip pain consistent with a right hip labral tear.  I did discuss that I would recommend engaging him with physical therapy at today's visit for strengthening of the hip.  He would also like an additional ultrasound-guided injection which I did provide after verbal consent obtained  -Right hip ultrasound-guided injection provided after verbal consent obtained    Procedure Note  Patient: Samuel Campbell             Date of Birth: 1975-09-02           MRN: [id number]             Visit Date: 09/08/2023  Procedures: Visit Diagnoses:  1. Pain in right hip     Large Joint Inj: R hip joint on 09/08/2023 3:10 PM Indications: pain Details: 22 G 3.5 in needle, ultrasound-guided anterolateral approach  Arthrogram: No  Medications: 4 mL lidocaine  1 %; 80 mg triamcinolone  acetonide 40 MG/ML Outcome: tolerated well, no immediate complications Procedure, treatment alternatives, risks and benefits explained, specific risks discussed. Consent was given by the patient. Immediately prior to procedure a time out was called to verify the correct patient, procedure, equipment, support staff and site/side marked as required. Patient was prepped and draped in the usual sterile fashion.        I personally saw and evaluated the patient, and participated in the management and treatment plan.  Elspeth Parker, MD Attending Physician, Orthopedic Surgery  This document was dictated using Dragon voice  recognition software. A reasonable attempt at proof reading has been made to minimize errors.

## 2023-09-28 NOTE — Therapy (Unsigned)
 OUTPATIENT PHYSICAL THERAPY LOWER EXTREMITY EVALUATION   Patient Name: Samuel Campbell MRN: [id number] DOB:August 25, 1975, 48 y.o., male Today's Date: 09/29/2023  END OF SESSION:  PT End of Session - 09/29/23 0808     Visit Number 1    Number of Visits 12    Date for PT Re-Evaluation 11/24/23    Authorization Type AETNA    PT Start Time 0710    PT Stop Time 0755    PT Time Calculation (min) 45 min    Activity Tolerance Patient tolerated treatment well    Behavior During Therapy [facility] for tasks assessed/performed          Past Medical History:  Diagnosis Date   Arthritis    Asthma    COVID 10/04/2019   sob, sinus issues, weaknessx 4 days all symptoms resolved   Elevated LFTs    Fatty liver 09/16/2020   GERD (gastroesophageal reflux disease)    Hepatic steatosis    High blood pressure 09/16/2020   Obesity    Plantar fascial fibromatosis of right foot 09/16/2020   wears insert   Pre-diabetes 09/16/2020   early dm per dr norleen general lov 09-16-2020   Sleep apnea    uses cpap does not know settings   Symptomatic cholelithiasis 09/16/2020   no recent symptoms per pt   Tendinitis    wears boot on left ankle when sleeping   Wears glasses 09/16/2020   Past Surgical History:  Procedure Laterality Date   ANKLE SURGERY Left 02/11/2018   CARPAL TUNNEL RELEASE Right    2006 or 2007 or 2008   CHOLECYSTECTOMY N/A 09/19/2020   Procedure: LAPAROSCOPIC CHOLECYSTECTOMY WITH INTRAOPERATIVE CHOLANGIOGRAM;  Surgeon: Sheldon Standing, MD;  Location: [facility] [place];  Service: General;  Laterality: N/A;   FOOT SURGERY Left 02/11/2018   for plantar fascilits   LIVER BIOPSY N/A 09/19/2020   Procedure: NEEDLE CORE BIOPSY OF LIVER;  Surgeon: Sheldon Standing, MD;  Location: [facility] [place];  Service: General;  Laterality: N/A;   TONSILLECTOMY     age 58   WISDOM TOOTH EXTRACTION     age 48   Patient Active Problem List   Diagnosis Date Noted   Allergic rhinitis due to animal  (cat) (dog) hair and dander 08/12/2020   Chronic allergic conjunctivitis 08/12/2020   Gastro-esophageal reflux disease without esophagitis 08/12/2020   Mild persistent asthma, uncomplicated 08/12/2020   Obesity (BMI 30-39.9) 08/12/2020   Chronic cholecystitis 08/12/2020   Prediabetes 08/12/2020    PCP: general norleen, MD   REFERRING PROVIDER: Genelle Standing, MD   REFERRING DIAG: Pain in right hip [M25.551]   THERAPY DIAG:  Pain in right hip  Difficulty in walking, not elsewhere classified  Muscle weakness (generalized)  Rationale for Evaluation and Treatment: Rehabilitation  ONSET DATE: 6 months ago.   SUBJECTIVE:   SUBJECTIVE STATEMENT: Pt arrives to PT with reports of acute on chronic R hip pain. He states that he drives from [place] to [place] every day, which he thought was the source of the pain. He has received imaging which shows a small tear in his superior labrum. Pain radiates from buttock to groin. Uses a seat cushion, topical cream and ibuprofen as needed. He has received two cortisone shots, but they were only successful for 1- 2 weeks.   PERTINENT HISTORY: Arthritis, Obesity, Pre- diabetes.  PAIN:  Are you having pain? Yes: NPRS scale: 5-6/10, worst: 7-8/10 Pain location: R hip pain, into buttocks.  Pain description: Dull ache.  Aggravating factors: Sitting, driving, squatting, stairs.  Relieving factors: Ice, meds, cushion.   PRECAUTIONS: None  RED FLAGS: None   WEIGHT BEARING RESTRICTIONS: No  FALLS:  Has patient fallen in last 6 months? No  LIVING ENVIRONMENT: Lives with: lives with their family Lives in: House/apartment Stairs: Yes: External: 5 steps; bilateral but cannot reach both Has following equipment at home: None  OCCUPATION: Supervisor of the state.   PLOF: Independent  PATIENT GOALS: Pt would to be able to reduce pain to drive comfortably.   NEXT MD VISIT: 10/20/2023  OBJECTIVE:  Note: Objective measures were completed at  Evaluation unless otherwise noted.  DIAGNOSTIC FINDINGS: Mild insertional tendinopathy of the gluteus medius and minimus on the right.   Small nondisplaced superior labral tear with images given above.  PATIENT SURVEYS:  Lower Extremity Functional Score: 26 / 80 = 32.5 %   COGNITION: Overall cognitive status: Within functional limits for tasks assessed     SENSATION: WFL  POSTURE: No Significant postural limitations  PALPATION: Tenderness surrounding hip   LOWER EXTREMITY ROM:  Active ROM Right eval Left eval  Hip flexion 76 100*  Hip extension    Hip abduction    Hip adduction    Hip internal rotation 3 0*  Hip external rotation 45 32*  Knee flexion [facility] WFL  Knee extension WFL WFL   (Blank rows = not tested)  LOWER EXTREMITY MMT:  MMT Right eval Left eval  Hip flexion 4 5  Knee flexion 4 5  Knee extension 4 5   (Blank rows = not tested)  LOWER EXTREMITY SPECIAL TESTS:  Hip special tests: Hip scouring test: positive   FUNCTIONAL TESTS:  5 times sit to stand: 15.14 sec   GAIT: Distance walked: 45ft  Assistive device utilized: None Level of assistance: Complete Independence Comments: Slight antalgic gait                                                                                                                                 TREATMENT DATE: Creating, reviewing, and completing below HEP    PATIENT EDUCATION:  Education details: Educated pt on anatomy and physiology of current symptoms, LEFS, diagnosis, prognosis, HEP,  and POC. Person educated: Patient Education method: Explanation, Demonstration, Tactile cues, and Verbal cues Education comprehension: verbalized understanding, returned demonstration, verbal cues required, and tactile cues required  HOME EXERCISE PROGRAM: Access Code: [email or web address] URL: [email or web address] Date: 09/29/2023 Prepared by: Rojean Batten  Exercises - Supine Bridge  - 1-2 x daily - 7 x weekly - 2 sets -  10 reps - Supine Quad Set  - 1-3 x daily - 7 x weekly - 2 sets - 10 reps - 3-5 hold - Seated Long Arc Quad  - 1-3 x daily - 7 x weekly - 2 sets - 10 reps - Seated Hamstring Stretch  - 1-2 x daily - 7 x weekly - 2 sets - 10 reps  ASSESSMENT:  CLINICAL IMPRESSION: Patient referred to PT for R hip. Patient will benefit from skilled PT to address below impairments, limitations and improve overall function.  OBJECTIVE IMPAIRMENTS: decreased activity tolerance, difficulty walking, decreased balance, decreased endurance, decreased mobility, decreased ROM, decreased strength, impaired flexibility, impaired UE/LE use, postural dysfunction, and pain.  ACTIVITY LIMITATIONS: bending, lifting, carry, locomotion, cleaning, community activity, driving, and or occupation  PERSONAL FACTORS: Arthritis, Obesity, Pre- diabetes are also affecting patient's functional outcome.  REHAB POTENTIAL: Good  CLINICAL DECISION MAKING: Stable/uncomplicated  EVALUATION COMPLEXITY: Low    GOALS: Short term PT Goals Target date: 10/13/2023 Pt will be I and compliant with HEP. Baseline:  Goal status: New Pt will decrease pain by 25% overall Baseline: Goal status: New  Long term PT goals Target date: 11/24/2023  1.  Pt will be able to demonstrate symmetrical PROM of the L hip  in order to demonstrate functional improvement in LE function for progression more normalized gait and community mobility.  Baseline:  Goal status: INITIAL 2. Pt will improve  hip/knee strength to at least 5-/5 MMT to improve functional strength Baseline: Goal status: New 3. Pt will improve LEFS to at least 11% functional to show improved function Baseline:32.5%  Goal status: New 4. Pt  will become independent with final HEP in order to demonstrate synthesis of PT education.     Baseline:      Goal status: INITIAL 5. Pt will be able to demonstrate full depth squat without pain in order to demonstrate functional improvement in LE  function for self-care and house hold duties.      Baseline:  Goal status: INITIAL 6. Pt will reduce pain by overall 50% overall with usual activity Baseline: Goal status: New 7. Pt will be able to ambulate community distances at least 1000 ft WNL gait pattern without complaints Baseline: Goal status: New   PLAN: PT FREQUENCY: 1-2 times per week   PT DURATION: 6-8 weeks  PLANNED INTERVENTIONS (unless contraindicated): aquatic PT, Canalith repositioning, cryotherapy, Electrical stimulation, Iontophoresis with 4 mg/ml dexamethasome, Moist heat, traction, Ultrasound, gait training, Therapeutic exercise, balance training, neuromuscular re-education, patient/family education, prosthetic training, manual techniques, passive ROM, dry needling, taping, vasopnuematic device, vestibular, spinal manipulations, joint manipulations  PLAN FOR NEXT SESSION: Decrease pain, improve ROM, strengthen proximal LE muscles.     Rojean JONELLE Batten, PT 09/29/2023, 8:09 AM

## 2023-09-29 ENCOUNTER — Ambulatory Visit (HOSPITAL_BASED_OUTPATIENT_CLINIC_OR_DEPARTMENT_OTHER): Attending: Orthopaedic Surgery | Admitting: Physical Therapy

## 2023-09-29 ENCOUNTER — Other Ambulatory Visit: Payer: Self-pay

## 2023-09-29 ENCOUNTER — Encounter (HOSPITAL_BASED_OUTPATIENT_CLINIC_OR_DEPARTMENT_OTHER): Payer: Self-pay | Admitting: Physical Therapy

## 2023-09-29 DIAGNOSIS — R262 Difficulty in walking, not elsewhere classified: Secondary | ICD-10-CM | POA: Insufficient documentation

## 2023-09-29 DIAGNOSIS — M6281 Muscle weakness (generalized): Secondary | ICD-10-CM | POA: Diagnosis present

## 2023-09-29 DIAGNOSIS — M25551 Pain in right hip: Secondary | ICD-10-CM | POA: Diagnosis present

## 2023-10-07 ENCOUNTER — Encounter (HOSPITAL_BASED_OUTPATIENT_CLINIC_OR_DEPARTMENT_OTHER): Payer: Self-pay | Admitting: Physical Therapy

## 2023-10-07 ENCOUNTER — Ambulatory Visit (HOSPITAL_BASED_OUTPATIENT_CLINIC_OR_DEPARTMENT_OTHER): Admitting: Physical Therapy

## 2023-10-07 DIAGNOSIS — M25551 Pain in right hip: Secondary | ICD-10-CM | POA: Diagnosis not present

## 2023-10-07 DIAGNOSIS — M6281 Muscle weakness (generalized): Secondary | ICD-10-CM

## 2023-10-07 DIAGNOSIS — R262 Difficulty in walking, not elsewhere classified: Secondary | ICD-10-CM

## 2023-10-07 NOTE — Therapy (Signed)
 OUTPATIENT PHYSICAL THERAPY LOWER EXTREMITY EVALUATION   Patient Name: Samuel Campbell MRN: [id number] DOB:Oct 07, 1975, 48 y.o., male Today's Date: 10/07/2023  END OF SESSION:  PT End of Session - 10/07/23 1608     Visit Number 2    Number of Visits 12    Date for PT Re-Evaluation 11/24/23    Authorization Type AETNA    PT Start Time 1530    PT Stop Time 1609    PT Time Calculation (min) 39 min    Activity Tolerance Patient tolerated treatment well    Behavior During Therapy [facility] for tasks assessed/performed           Past Medical History:  Diagnosis Date   Arthritis    Asthma    COVID 10/04/2019   sob, sinus issues, weaknessx 4 days all symptoms resolved   Elevated LFTs    Fatty liver 09/16/2020   GERD (gastroesophageal reflux disease)    Hepatic steatosis    High blood pressure 09/16/2020   Obesity    Plantar fascial fibromatosis of right foot 09/16/2020   wears insert   Pre-diabetes 09/16/2020   early dm per dr norleen general lov 09-16-2020   Sleep apnea    uses cpap does not know settings   Symptomatic cholelithiasis 09/16/2020   no recent symptoms per pt   Tendinitis    wears boot on left ankle when sleeping   Wears glasses 09/16/2020   Past Surgical History:  Procedure Laterality Date   ANKLE SURGERY Left 02/11/2018   CARPAL TUNNEL RELEASE Right    2006 or 2007 or 2008   CHOLECYSTECTOMY N/A 09/19/2020   Procedure: LAPAROSCOPIC CHOLECYSTECTOMY WITH INTRAOPERATIVE CHOLANGIOGRAM;  Surgeon: Sheldon Standing, MD;  Location: [facility];  Service: General;  Laterality: N/A;   FOOT SURGERY Left 02/11/2018   for plantar fascilits   LIVER BIOPSY N/A 09/19/2020   Procedure: NEEDLE CORE BIOPSY OF LIVER;  Surgeon: Sheldon Standing, MD;  Location: [facility] [place];  Service: General;  Laterality: N/A;   TONSILLECTOMY     age 45   WISDOM TOOTH EXTRACTION     age 58   Patient Active Problem List   Diagnosis Date Noted   Allergic rhinitis due to  animal (cat) (dog) hair and dander 08/12/2020   Chronic allergic conjunctivitis 08/12/2020   Gastro-esophageal reflux disease without esophagitis 08/12/2020   Mild persistent asthma, uncomplicated 08/12/2020   Obesity (BMI 30-39.9) 08/12/2020   Chronic cholecystitis 08/12/2020   Prediabetes 08/12/2020    PCP: general norleen, MD   REFERRING PROVIDER: Genelle Standing, MD   REFERRING DIAG: Pain in right hip [M25.551]   THERAPY DIAG:  Pain in right hip  Difficulty in walking, not elsewhere classified  Muscle weakness (generalized)  Rationale for Evaluation and Treatment: Rehabilitation  ONSET DATE: 6 months ago.   SUBJECTIVE:   SUBJECTIVE STATEMENT: Pt reports pain is still similar but has improved since stopping driving. Pt reports feeling good after session but pain increased for about a day or two. Felt better for about an hour.   PERTINENT HISTORY: Arthritis, Obesity, Pre- diabetes.  PAIN:  Are you having pain? Yes: NPRS scale: 5-6/10, worst: 7-8/10 Pain location: R hip pain, into buttocks.  Pain description: Dull ache.  Aggravating factors: Sitting, driving, squatting, stairs.  Relieving factors: Ice, meds, cushion.   PRECAUTIONS: None  RED FLAGS: None   WEIGHT BEARING RESTRICTIONS: No  FALLS:  Has patient fallen in last 6 months? No  LIVING ENVIRONMENT: Lives with: lives  with their family Lives in: House/apartment Stairs: Yes: External: 5 steps; bilateral but cannot reach both Has following equipment at home: None  OCCUPATION: Supervisor of the state.   PLOF: Independent  PATIENT GOALS: Pt would to be able to reduce pain to drive comfortably.   NEXT MD VISIT: 10/20/2023  OBJECTIVE:  Note: Objective measures were completed at Evaluation unless otherwise noted.  DIAGNOSTIC FINDINGS: Mild insertional tendinopathy of the gluteus medius and minimus on the right.   Small nondisplaced superior labral tear with images given above.  PATIENT SURVEYS:   Lower Extremity Functional Score: 26 / 80 = 32.5 %   COGNITION: Overall cognitive status: Within functional limits for tasks assessed     SENSATION: WFL  POSTURE: No Significant postural limitations  PALPATION: Tenderness surrounding hip   LOWER EXTREMITY ROM:  Active ROM Right eval Left eval  Hip flexion 76 100*  Hip extension    Hip abduction    Hip adduction    Hip internal rotation 3 0*  Hip external rotation 45 32*  Knee flexion [facility] WFL  Knee extension WFL WFL   (Blank rows = not tested)  LOWER EXTREMITY MMT:  MMT Right eval Left eval  Hip flexion 4 5  Knee flexion 4 5  Knee extension 4 5   (Blank rows = not tested)  LOWER EXTREMITY SPECIAL TESTS:  Hip special tests: Hip scouring test: positive   FUNCTIONAL TESTS:  5 times sit to stand: 15.14 sec   GAIT: Distance walked: 76ft  Assistive device utilized: None Level of assistance: Complete Independence Comments: Slight antalgic gait                                                                                                                                 TREATMENT DATE:   7/31  STM R posterior hip/deep hip rotators   Supine hip ABD iso 5s 2x10 Prone quad stretch 30s 3x Chidl's pose 10x  Edu regardig conservatve vs surgical management, symptoms management, acceptable levels of pain   PATIENT EDUCATION:  Education details: diagnosis, prognosis, anatomy, exercise progression, DOMS expectations, muscle firing,  envelope of function, HEP, POC  Person educated: Patient Education method: Explanation, Demonstration, Tactile cues, and Verbal cues Education comprehension: verbalized understanding, returned demonstration, verbal cues required, and tactile cues required  HOME EXERCISE PROGRAM: Access Code: [email or web address] URL: [email or web address] Date: 09/29/2023 Prepared by: Rojean Batten  Exercises - Supine Bridge  - 1-2 x daily - 7 x weekly - 2 sets - 10 reps - Supine Quad Set  -  1-3 x daily - 7 x weekly - 2 sets - 10 reps - 3-5 hold - Seated Long Arc Quad  - 1-3 x daily - 7 x weekly - 2 sets - 10 reps - Seated Hamstring Stretch  - 1-2 x daily - 7 x weekly - 2 sets - 10 reps   ASSESSMENT:  CLINICAL IMPRESSION:   Pt pain consistent with multifactorial  lumbar, hip, and nerve related pain contributing to current presentation. Pt did respond well to manual and progressive stretching as pt is particularly stiff with movement. Pt reports improvement in walking and hip stiffness following session. HEP updated. Plan to continue with pain management and progressive hip mobility as tolerated. Patient will benefit from skilled PT to address below impairments, limitations and improve overall function.  OBJECTIVE IMPAIRMENTS: decreased activity tolerance, difficulty walking, decreased balance, decreased endurance, decreased mobility, decreased ROM, decreased strength, impaired flexibility, impaired UE/LE use, postural dysfunction, and pain.  ACTIVITY LIMITATIONS: bending, lifting, carry, locomotion, cleaning, community activity, driving, and or occupation  PERSONAL FACTORS: Arthritis, Obesity, Pre- diabetes are also affecting patient's functional outcome.  REHAB POTENTIAL: Good  CLINICAL DECISION MAKING: Stable/uncomplicated  EVALUATION COMPLEXITY: Low    GOALS: Short term PT Goals Target date: 10/13/2023 Pt will be I and compliant with HEP. Baseline:  Goal status: New Pt will decrease pain by 25% overall Baseline: Goal status: New  Long term PT goals Target date: 11/24/2023  1.  Pt will be able to demonstrate symmetrical PROM of the L hip  in order to demonstrate functional improvement in LE function for progression more normalized gait and community mobility.  Baseline:  Goal status: INITIAL 2. Pt will improve  hip/knee strength to at least 5-/5 MMT to improve functional strength Baseline: Goal status: New 3. Pt will improve LEFS to at least 11% functional to  show improved function Baseline:32.5%  Goal status: New 4. Pt  will become independent with final HEP in order to demonstrate synthesis of PT education.     Baseline:      Goal status: INITIAL 5. Pt will be able to demonstrate full depth squat without pain in order to demonstrate functional improvement in LE function for self-care and house hold duties.      Baseline:  Goal status: INITIAL 6. Pt will reduce pain by overall 50% overall with usual activity Baseline: Goal status: New 7. Pt will be able to ambulate community distances at least 1000 ft WNL gait pattern without complaints Baseline: Goal status: New   PLAN: PT FREQUENCY: 1-2 times per week   PT DURATION: 6-8 weeks  PLANNED INTERVENTIONS (unless contraindicated): aquatic PT, Canalith repositioning, cryotherapy, Electrical stimulation, Iontophoresis with 4 mg/ml dexamethasome, Moist heat, traction, Ultrasound, gait training, Therapeutic exercise, balance training, neuromuscular re-education, patient/family education, prosthetic training, manual techniques, passive ROM, dry needling, taping, vasopnuematic device, vestibular, spinal manipulations, joint manipulations  PLAN FOR NEXT SESSION: Decrease pain, improve ROM, strengthen proximal LE muscles.     Dale Call, PT 10/07/2023, 4:15 PM

## 2023-10-12 ENCOUNTER — Ambulatory Visit (HOSPITAL_BASED_OUTPATIENT_CLINIC_OR_DEPARTMENT_OTHER): Attending: Orthopaedic Surgery | Admitting: Physical Therapy

## 2023-10-12 ENCOUNTER — Encounter (HOSPITAL_BASED_OUTPATIENT_CLINIC_OR_DEPARTMENT_OTHER): Payer: Self-pay | Admitting: Physical Therapy

## 2023-10-12 DIAGNOSIS — M25551 Pain in right hip: Secondary | ICD-10-CM | POA: Insufficient documentation

## 2023-10-12 DIAGNOSIS — M6281 Muscle weakness (generalized): Secondary | ICD-10-CM | POA: Diagnosis present

## 2023-10-12 DIAGNOSIS — R262 Difficulty in walking, not elsewhere classified: Secondary | ICD-10-CM | POA: Diagnosis present

## 2023-10-12 NOTE — Therapy (Signed)
 OUTPATIENT PHYSICAL THERAPY LOWER EXTREMITY EVALUATION   Patient Name: Samuel Campbell MRN: [id number] DOB:1975/12/02, 48 y.o., male Today's Date: 10/12/2023  END OF SESSION:  PT End of Session - 10/12/23 1513     Visit Number 3    Number of Visits 12    Date for PT Re-Evaluation 11/24/23    Authorization Type AETNA    PT Start Time 1513    PT Stop Time 1555    PT Time Calculation (min) 42 min    Activity Tolerance Patient tolerated treatment well    Behavior During Therapy [facility] for tasks assessed/performed           Past Medical History:  Diagnosis Date   Arthritis    Asthma    COVID 10/04/2019   sob, sinus issues, weaknessx 4 days all symptoms resolved   Elevated LFTs    Fatty liver 09/16/2020   GERD (gastroesophageal reflux disease)    Hepatic steatosis    High blood pressure 09/16/2020   Obesity    Plantar fascial fibromatosis of right foot 09/16/2020   wears insert   Pre-diabetes 09/16/2020   early dm per dr norleen general lov 09-16-2020   Sleep apnea    uses cpap does not know settings   Symptomatic cholelithiasis 09/16/2020   no recent symptoms per pt   Tendinitis    wears boot on left ankle when sleeping   Wears glasses 09/16/2020   Past Surgical History:  Procedure Laterality Date   ANKLE SURGERY Left 02/11/2018   CARPAL TUNNEL RELEASE Right    2006 or 2007 or 2008   CHOLECYSTECTOMY N/A 09/19/2020   Procedure: LAPAROSCOPIC CHOLECYSTECTOMY WITH INTRAOPERATIVE CHOLANGIOGRAM;  Surgeon: Sheldon Standing, MD;  Location: [facility] [place];  Service: General;  Laterality: N/A;   FOOT SURGERY Left 02/11/2018   for plantar fascilits   LIVER BIOPSY N/A 09/19/2020   Procedure: NEEDLE CORE BIOPSY OF LIVER;  Surgeon: Sheldon Standing, MD;  Location: [facility] [place];  Service: General;  Laterality: N/A;   TONSILLECTOMY     age 75   WISDOM TOOTH EXTRACTION     age 26   Patient Active Problem List   Diagnosis Date Noted   Allergic rhinitis due to  animal (cat) (dog) hair and dander 08/12/2020   Chronic allergic conjunctivitis 08/12/2020   Gastro-esophageal reflux disease without esophagitis 08/12/2020   Mild persistent asthma, uncomplicated 08/12/2020   Obesity (BMI 30-39.9) 08/12/2020   Chronic cholecystitis 08/12/2020   Prediabetes 08/12/2020    PCP: general norleen, MD   REFERRING PROVIDER: Genelle Standing, MD   REFERRING DIAG: Pain in right hip [M25.551]   THERAPY DIAG:  Pain in right hip  Difficulty in walking, not elsewhere classified  Muscle weakness (generalized)  Rationale for Evaluation and Treatment: Rehabilitation  ONSET DATE: 6 months ago.   SUBJECTIVE:   SUBJECTIVE STATEMENT: Pt reports feels like it is getting worse. Feels better in the moment but pain then when doing something. Pain right at the hip bone  PERTINENT HISTORY: Arthritis, Obesity, Pre- diabetes.  PAIN:  Are you having pain? Yes: NPRS scale: 6/10, worst: 7-8/10 Pain location: R hip pain, into buttocks.  Pain description: Dull ache.  Aggravating factors: Sitting, driving, squatting, stairs.  Relieving factors: Ice, meds, cushion.   PRECAUTIONS: None  RED FLAGS: None   WEIGHT BEARING RESTRICTIONS: No  FALLS:  Has patient fallen in last 6 months? No  LIVING ENVIRONMENT: Lives with: lives with their family Lives in: House/apartment Stairs: Yes: External:  5 steps; bilateral but cannot reach both Has following equipment at home: None  OCCUPATION: Supervisor of the state.   PLOF: Independent  PATIENT GOALS: Pt would to be able to reduce pain to drive comfortably.   NEXT MD VISIT: 10/20/2023  OBJECTIVE:  Note: Objective measures were completed at Evaluation unless otherwise noted.  DIAGNOSTIC FINDINGS: Mild insertional tendinopathy of the gluteus medius and minimus on the right.   Small nondisplaced superior labral tear with images given above.  PATIENT SURVEYS:  Lower Extremity Functional Score: 26 / 80 = 32.5  %   COGNITION: Overall cognitive status: Within functional limits for tasks assessed     SENSATION: WFL  POSTURE: No Significant postural limitations  PALPATION: Tenderness surrounding hip   LOWER EXTREMITY ROM:  Active ROM Right eval Left eval  Hip flexion 76 100*  Hip extension    Hip abduction    Hip adduction    Hip internal rotation 3 0*  Hip external rotation 45 32*  Knee flexion [facility] WFL  Knee extension WFL WFL   (Blank rows = not tested)  LOWER EXTREMITY MMT:  MMT Right eval Left eval  Hip flexion 4 5  Knee flexion 4 5  Knee extension 4 5   (Blank rows = not tested)  LOWER EXTREMITY SPECIAL TESTS:  Hip special tests: Hip scouring test: positive   FUNCTIONAL TESTS:  5 times sit to stand: 15.14 sec   GAIT: Distance walked: 53ft  Assistive device utilized: None Level of assistance: Complete Independence Comments: Slight antalgic gait                                                                                                                                 TREATMENT DATE:  10/12/23 Manual: STM to R posterior hip/deep hip rotators; prone hip PA glide in progressive ER/IR Grade II Prone hip extension 2 x 10  Standing hip abduction RTB at knees 2 x 10 STS 2 x 10   7/31  STM R posterior hip/deep hip rotators   Supine hip ABD iso 5s 2x10 Prone quad stretch 30s 3x Chidl's pose 10x  Edu regardig conservatve vs surgical management, symptoms management, acceptable levels of pain   PATIENT EDUCATION:  Education details: diagnosis, prognosis, anatomy, exercise progression, DOMS expectations, muscle firing,  envelope of function, HEP, POC  Person educated: Patient Education method: Explanation, Demonstration, Tactile cues, and Verbal cues Education comprehension: verbalized understanding, returned demonstration, verbal cues required, and tactile cues required  HOME EXERCISE PROGRAM: Access Code: [email or web address] URL:  [email or web address] Date: 09/29/2023 Prepared by: Rojean Batten  Exercises - Supine Bridge  - 1-2 x daily - 7 x weekly - 2 sets - 10 reps - Supine Quad Set  - 1-3 x daily - 7 x weekly - 2 sets - 10 reps - 3-5 hold - Seated Long Arc Quad  - 1-3 x daily - 7 x weekly - 2 sets - 10 reps - Seated  Hamstring Stretch  - 1-2 x daily - 7 x weekly - 2 sets - 10 reps   ASSESSMENT:  CLINICAL IMPRESSION:   Manual for hip mobility with improvement in ROM and decrease in tissue tension. Continued with glute strengthening and additional functional strengthening. Patient will continue to benefit from physical therapy in order to improve function and reduce impairment.   OBJECTIVE IMPAIRMENTS: decreased activity tolerance, difficulty walking, decreased balance, decreased endurance, decreased mobility, decreased ROM, decreased strength, impaired flexibility, impaired UE/LE use, postural dysfunction, and pain.  ACTIVITY LIMITATIONS: bending, lifting, carry, locomotion, cleaning, community activity, driving, and or occupation  PERSONAL FACTORS: Arthritis, Obesity, Pre- diabetes are also affecting patient's functional outcome.  REHAB POTENTIAL: Good  CLINICAL DECISION MAKING: Stable/uncomplicated  EVALUATION COMPLEXITY: Low    GOALS: Short term PT Goals Target date: 10/13/2023 Pt will be I and compliant with HEP. Baseline:  Goal status: New Pt will decrease pain by 25% overall Baseline: Goal status: New  Long term PT goals Target date: 11/24/2023  1.  Pt will be able to demonstrate symmetrical PROM of the L hip  in order to demonstrate functional improvement in LE function for progression more normalized gait and community mobility.  Baseline:  Goal status: INITIAL 2. Pt will improve  hip/knee strength to at least 5-/5 MMT to improve functional strength Baseline: Goal status: New 3. Pt will improve LEFS to at least 11% functional to show improved function Baseline:32.5%  Goal  status: New 4. Pt  will become independent with final HEP in order to demonstrate synthesis of PT education.     Baseline:      Goal status: INITIAL 5. Pt will be able to demonstrate full depth squat without pain in order to demonstrate functional improvement in LE function for self-care and house hold duties.      Baseline:  Goal status: INITIAL 6. Pt will reduce pain by overall 50% overall with usual activity Baseline: Goal status: New 7. Pt will be able to ambulate community distances at least 1000 ft WNL gait pattern without complaints Baseline: Goal status: New   PLAN: PT FREQUENCY: 1-2 times per week   PT DURATION: 6-8 weeks  PLANNED INTERVENTIONS (unless contraindicated): aquatic PT, Canalith repositioning, cryotherapy, Electrical stimulation, Iontophoresis with 4 mg/ml dexamethasome, Moist heat, traction, Ultrasound, gait training, Therapeutic exercise, balance training, neuromuscular re-education, patient/family education, prosthetic training, manual techniques, passive ROM, dry needling, taping, vasopnuematic device, vestibular, spinal manipulations, joint manipulations  PLAN FOR NEXT SESSION: Decrease pain, improve ROM, strengthen proximal LE muscles.     Prentice RAMAN Leira Regino, PT 10/12/2023, 3:59 PM

## 2023-10-13 ENCOUNTER — Encounter (HOSPITAL_BASED_OUTPATIENT_CLINIC_OR_DEPARTMENT_OTHER): Admitting: Physical Therapy

## 2023-10-20 ENCOUNTER — Ambulatory Visit (HOSPITAL_BASED_OUTPATIENT_CLINIC_OR_DEPARTMENT_OTHER): Payer: Self-pay | Admitting: Orthopaedic Surgery

## 2023-10-20 ENCOUNTER — Encounter (HOSPITAL_BASED_OUTPATIENT_CLINIC_OR_DEPARTMENT_OTHER): Payer: Self-pay | Admitting: Physical Therapy

## 2023-10-20 ENCOUNTER — Ambulatory Visit (HOSPITAL_BASED_OUTPATIENT_CLINIC_OR_DEPARTMENT_OTHER): Admitting: Physical Therapy

## 2023-10-20 ENCOUNTER — Ambulatory Visit (HOSPITAL_BASED_OUTPATIENT_CLINIC_OR_DEPARTMENT_OTHER): Admitting: Orthopaedic Surgery

## 2023-10-20 DIAGNOSIS — M6281 Muscle weakness (generalized): Secondary | ICD-10-CM

## 2023-10-20 DIAGNOSIS — M25551 Pain in right hip: Secondary | ICD-10-CM | POA: Diagnosis not present

## 2023-10-20 DIAGNOSIS — R262 Difficulty in walking, not elsewhere classified: Secondary | ICD-10-CM

## 2023-10-20 NOTE — Progress Notes (Addendum)
 Chief Complaint: Right hip pain     History of Present Illness:   10/20/2023: Presents today for follow-up of his MRI.  He is now 4 weeks into physical therapy without relief.  He did get nearly 5 days of complete relief of his pain following his injection  Initial HPI: Samuel Campbell is a 48 y.o. male presents today with ongoing right hip pain which does radiate to the buttocks as well as to the front and lateral aspect of the hip.  Initially his pain began after a fall directly onto his right side.  He has having pain over the last several months which is worse with activity or even with sitting for longer periods of time.  Did have a fall directly on the side he has trialed anti-inflammatories.  He has not had any injections in the past.    PMH/PSH/Family History/Social History/Meds/Allergies:    Past Medical History:  Diagnosis Date   Arthritis    Asthma    COVID 10/04/2019   sob, sinus issues, weaknessx 4 days all symptoms resolved   Elevated LFTs    Fatty liver 09/16/2020   GERD (gastroesophageal reflux disease)    Hepatic steatosis    High blood pressure 09/16/2020   Obesity    Plantar fascial fibromatosis of right foot 09/16/2020   wears insert   Pre-diabetes 09/16/2020   early dm per dr norleen general lov 09-16-2020   Sleep apnea    uses cpap does not know settings   Symptomatic cholelithiasis 09/16/2020   no recent symptoms per pt   Tendinitis    wears boot on left ankle when sleeping   Wears glasses 09/16/2020   Past Surgical History:  Procedure Laterality Date   ANKLE SURGERY Left 02/11/2018   CARPAL TUNNEL RELEASE Right    2006 or 2007 or 2008   CHOLECYSTECTOMY N/A 09/19/2020   Procedure: LAPAROSCOPIC CHOLECYSTECTOMY WITH INTRAOPERATIVE CHOLANGIOGRAM;  Surgeon: Sheldon Standing, MD;  Location: [facility];  Service: General;  Laterality: N/A;   FOOT SURGERY Left 02/11/2018   for plantar fascilits   LIVER BIOPSY N/A 09/19/2020   Procedure:  NEEDLE CORE BIOPSY OF LIVER;  Surgeon: Sheldon Standing, MD;  Location: [facility];  Service: General;  Laterality: N/A;   TONSILLECTOMY     age 65   WISDOM TOOTH EXTRACTION     age 55   Social History   Socioeconomic History   Marital status: Married    Spouse name: Not on file   Number of children: Not on file   Years of education: Not on file   Highest education level: Not on file  Occupational History   Not on file  Tobacco Use   Smoking status: Former    Current packs/day: 0.00    Average packs/day: 1.5 packs/day for 15.0 years (22.5 ttl pk-yrs)    Types: Cigarettes    Start date: 08/21/1986    Quit date: 08/20/2001    Years since quitting: 22.1   Smokeless tobacco: Former    Quit date: 08/20/2001  Vaping Use   Vaping status: Never Used  Substance and Sexual Activity   Alcohol use: Not Currently   Drug use: Never   Sexual activity: Not Currently  Other Topics Concern   Not on file  Social History Narrative   Not on file   Social Drivers of Health   Financial Resource Strain: Not on file  Food Insecurity: Not on file  Transportation Needs: Not on file  Physical Activity: Not on file  Stress: Not on file  Social Connections: Not on file   Family History  Problem Relation Age of Onset   Hypertension Mother    Celiac disease Mother    Colon polyps Mother    Diabetes Father    Alzheimer's disease Maternal Grandmother    COPD Maternal Grandfather    Asthma Maternal Grandfather    Diabetes Paternal Grandmother    Heart disease Paternal Grandmother    Diabetes Paternal Grandfather    Cirrhosis Other        Great uncle   Colon cancer Neg Hx    Stomach cancer Neg Hx    Esophageal cancer Neg Hx    Pancreatic cancer Neg Hx    No Known Allergies Current Outpatient Medications  Medication Sig Dispense Refill   aspirin 81 MG chewable tablet Chew 81 mg by mouth daily.     budesonide-formoterol (SYMBICORT) 80-4.5 MCG/ACT inhaler Inhale 2 puffs into  the lungs as needed.     Cholecalciferol (VITAMIN D3 PO) Take 1 tablet by mouth daily.     dexlansoprazole  (DEXILANT ) 60 MG capsule Take 1 capsule (60 mg total) by mouth daily. (Patient not taking: Reported on 09/29/2023) 90 capsule 3   dicyclomine  (BENTYL ) 20 MG tablet TAKE 1 TABLET BY MOUTH 4 TIMES DAILY BEFORE MEAL(S) AND AT BEDTIME (Patient not taking: Reported on 09/29/2023) 90 tablet 0   ELDERBERRY PO Take 1 tablet by mouth daily.     esomeprazole (NEXIUM) 20 MG packet Take 20 mg by mouth daily before breakfast.     fluticasone (FLONASE) 50 MCG/ACT nasal spray Place 1 spray into both nostrils 2 (two) times daily.     hydrochlorothiazide (HYDRODIURIL) 25 MG tablet Take 25 mg by mouth daily.     ipratropium (ATROVENT) 0.03 % nasal spray Place 1 spray into both nostrils as needed.  5   levocetirizine (XYZAL) 5 MG tablet Take 5 mg by mouth every evening.     losartan (COZAAR) 100 MG tablet Take 100 mg by mouth daily.     montelukast (SINGULAIR) 10 MG tablet Take 10 mg by mouth at bedtime.     Olopatadine HCl 0.2 % SOLN Place 1 drop into both eyes once a week. Pataday per pt on Monday and prn when takes contacts out     ondansetron  (ZOFRAN ) 4 MG tablet Take 1 tablet (4 mg total) by mouth every 8 (eight) hours as needed for nausea. (Patient not taking: Reported on 09/29/2023) 8 tablet 5   PROAIR HFA 108 (90 Base) MCG/ACT inhaler Inhale 2 puffs into the lungs as needed. Albuterol  2   traMADol  (ULTRAM ) 50 MG tablet Take 1-2 tablets (50-100 mg total) by mouth every 6 (six) hours as needed for moderate pain or severe pain. 20 tablet 0   vitamin C (ASCORBIC ACID) 500 MG tablet Take 1 tablet by mouth daily.     vitamin E 180 MG (400 UNITS) capsule Take 800 Units by mouth daily.     No current facility-administered medications for this visit.   No results found.  Review of Systems:   A ROS was performed including pertinent positives and negatives as documented in the HPI.  Physical Exam :    Constitutional: NAD and appears stated age Neurological: Alert and oriented Psych: Appropriate affect and cooperative There were no vitals taken for this visit.   Comprehensive Musculoskeletal Exam:    Right hip with tenderness about the femoral acetabular joint with positive FADIR and  30 degrees internal rotation with 30 degrees external rotation relieving the pain.  Distal neurosensory exam is intact.  No weakness with hip abduction and normal gait   Imaging:   Xray (4 views right hip): Normal  MRI right hip: Anterior superior labral tear without evidence of arthritis or chondral loss  I personally reviewed and interpreted the radiographs.   Assessment and Plan:   48 y.o. male with right hip pain consistent with a right hip labral tear.  At this time he has had 2 injections which did give him temporary and significant relief.  He is not getting permanent relief with physical therapy and as result we did briefly discuss hip arthroscopy with labral repair.  He did have a fall 3 months prior at his initial injury and since this time has been having mechanical symptoms with popping and clicking.  He has now trialed 6 weeks of physical therapy without any pain relief.   I did discuss the risks and alternatives as well as limitations.   -Plan for right hip arthroscopy with labral repair if patient fails 6 weeks of conservative management with therapy   After a lengthy discussion of treatment options, including risks, benefits, alternatives, complications of surgical and nonsurgical conservative options, the patient elected surgical repair.   The patient  is aware of the material risks  and complications including, but not limited to injury to adjacent structures, neurovascular injury, infection, numbness, bleeding, implant failure, thermal burns, stiffness, persistent pain, failure to heal, disease transmission from allograft, need for further surgery, dislocation, anesthetic risks, blood  clots, risks of death,and others. The probabilities of surgical success and failure discussed with patient given their particular co-morbidities.The time and nature of expected rehabilitation and recovery was discussed.The patient's questions were all answered preoperatively.  No barriers to understanding were noted. I explained the natural history of the disease process and Rx rationale.  I explained to the patient what I considered to be reasonable expectations given their personal situation.  The final treatment plan was arrived at through a shared patient decision making process model.       I personally saw and evaluated the patient, and participated in the management and treatment plan.  Elspeth Parker, MD Attending Physician, Orthopedic Surgery  This document was dictated using Dragon voice recognition software. A reasonable attempt at proof reading has been made to minimize errors.

## 2023-10-20 NOTE — Therapy (Signed)
 OUTPATIENT PHYSICAL THERAPY LOWER EXTREMITY EVALUATION   Patient Name: Samuel Campbell MRN: [id number] DOB:May 19, 1975, 48 y.o., male Today's Date: 10/20/2023  END OF SESSION:  PT End of Session - 10/20/23 1351     Visit Number 4    Number of Visits 12    Date for PT Re-Evaluation 11/24/23    Authorization Type AETNA    PT Start Time 1349    PT Stop Time 1429    PT Time Calculation (min) 40 min    Activity Tolerance Patient tolerated treatment well    Behavior During Therapy [facility] for tasks assessed/performed           Past Medical History:  Diagnosis Date   Arthritis    Asthma    COVID 10/04/2019   sob, sinus issues, weaknessx 4 days all symptoms resolved   Elevated LFTs    Fatty liver 09/16/2020   GERD (gastroesophageal reflux disease)    Hepatic steatosis    High blood pressure 09/16/2020   Obesity    Plantar fascial fibromatosis of right foot 09/16/2020   wears insert   Pre-diabetes 09/16/2020   early dm per dr norleen general lov 09-16-2020   Sleep apnea    uses cpap does not know settings   Symptomatic cholelithiasis 09/16/2020   no recent symptoms per pt   Tendinitis    wears boot on left ankle when sleeping   Wears glasses 09/16/2020   Past Surgical History:  Procedure Laterality Date   ANKLE SURGERY Left 02/11/2018   CARPAL TUNNEL RELEASE Right    2006 or 2007 or 2008   CHOLECYSTECTOMY N/A 09/19/2020   Procedure: LAPAROSCOPIC CHOLECYSTECTOMY WITH INTRAOPERATIVE CHOLANGIOGRAM;  Surgeon: Sheldon Standing, MD;  Location: [facility];  Service: General;  Laterality: N/A;   FOOT SURGERY Left 02/11/2018   for plantar fascilits   LIVER BIOPSY N/A 09/19/2020   Procedure: NEEDLE CORE BIOPSY OF LIVER;  Surgeon: Sheldon Standing, MD;  Location: [facility];  Service: General;  Laterality: N/A;   TONSILLECTOMY     age 3   WISDOM TOOTH EXTRACTION     age 21   Patient Active Problem List   Diagnosis Date Noted   Allergic rhinitis due to  animal (cat) (dog) hair and dander 08/12/2020   Chronic allergic conjunctivitis 08/12/2020   Gastro-esophageal reflux disease without esophagitis 08/12/2020   Mild persistent asthma, uncomplicated 08/12/2020   Obesity (BMI 30-39.9) 08/12/2020   Chronic cholecystitis 08/12/2020   Prediabetes 08/12/2020    PCP: general norleen, MD   REFERRING PROVIDER: Genelle Standing, MD   REFERRING DIAG: Pain in right hip [M25.551]   THERAPY DIAG:  Pain in right hip  Difficulty in walking, not elsewhere classified  Muscle weakness (generalized)  Rationale for Evaluation and Treatment: Rehabilitation  ONSET DATE: 6 months ago.   SUBJECTIVE:   SUBJECTIVE STATEMENT: Pt reports feels like it is getting worse. Feels better in the moment but pain then when doing something. Pain right at the hip bone  PERTINENT HISTORY: Arthritis, Obesity, Pre- diabetes.  PAIN:  Are you having pain? Yes: NPRS scale: 6/10, worst: 7-8/10 Pain location: R hip pain, into buttocks.  Pain description: Dull ache.  Aggravating factors: Sitting, driving, squatting, stairs.  Relieving factors: Ice, meds, cushion.   PRECAUTIONS: None  RED FLAGS: None   WEIGHT BEARING RESTRICTIONS: No  FALLS:  Has patient fallen in last 6 months? No  LIVING ENVIRONMENT: Lives with: lives with their family Lives in: House/apartment Stairs: Yes: External:  5 steps; bilateral but cannot reach both Has following equipment at home: None  OCCUPATION: Supervisor of the state.   PLOF: Independent  PATIENT GOALS: Pt would to be able to reduce pain to drive comfortably.   NEXT MD VISIT: 10/20/2023  OBJECTIVE:  Note: Objective measures were completed at Evaluation unless otherwise noted.  DIAGNOSTIC FINDINGS: Mild insertional tendinopathy of the gluteus medius and minimus on the right.   Small nondisplaced superior labral tear with images given above.  PATIENT SURVEYS:  Lower Extremity Functional Score: 26 / 80 = 32.5  %   COGNITION: Overall cognitive status: Within functional limits for tasks assessed     SENSATION: WFL  POSTURE: No Significant postural limitations  PALPATION: Tenderness surrounding hip   LOWER EXTREMITY ROM:  Active ROM Right eval Left eval  Hip flexion 76 100*  Hip extension    Hip abduction    Hip adduction    Hip internal rotation 3 0*  Hip external rotation 45 32*  Knee flexion [facility] WFL  Knee extension WFL WFL   (Blank rows = not tested)  LOWER EXTREMITY MMT:  MMT Right eval Left eval  Hip flexion 4 5  Knee flexion 4 5  Knee extension 4 5   (Blank rows = not tested)  LOWER EXTREMITY SPECIAL TESTS:  Hip special tests: Hip scouring test: positive   FUNCTIONAL TESTS:  5 times sit to stand: 15.14 sec   GAIT: Distance walked: 56ft  Assistive device utilized: None Level of assistance: Complete Independence Comments: Slight antalgic gait                                                                                                                                 TREATMENT DATE:  10/20/23 Supine piriformis stretch 5 x 20 second holds Manual: STM/TPR to hip flexors, belt lateral traction mobs Hip hike of step 2 x 10 Step up with contralateral knee drive 4 inch 1 x 20, 1 x 15   10/12/23 Manual: STM to R posterior hip/deep hip rotators; prone hip PA glide in progressive ER/IR Grade II Prone hip extension 2 x 10  Standing hip abduction RTB at knees 2 x 10 STS 2 x 10   7/31  STM R posterior hip/deep hip rotators   Supine hip ABD iso 5s 2x10 Prone quad stretch 30s 3x Chidl's pose 10x  Edu regardig conservatve vs surgical management, symptoms management, acceptable levels of pain   PATIENT EDUCATION:  Education details: diagnosis, prognosis, anatomy, exercise progression, DOMS expectations, muscle firing,  envelope of function, HEP, POC  Person educated: Patient Education method: Explanation, Demonstration, Tactile cues, and Verbal  cues Education comprehension: verbalized understanding, returned demonstration, verbal cues required, and tactile cues required  HOME EXERCISE PROGRAM: Access Code: [email or web address] URL: [email or web address] Date: 09/29/2023 Prepared by: Rojean Batten  Exercises - Supine Bridge  - 1-2 x daily - 7 x weekly - 2 sets - 10 reps - Supine Quad  Set  - 1-3 x daily - 7 x weekly - 2 sets - 10 reps - 3-5 hold - Seated Long Arc Quad  - 1-3 x daily - 7 x weekly - 2 sets - 10 reps - Seated Hamstring Stretch  - 1-2 x daily - 7 x weekly - 2 sets - 10 reps   ASSESSMENT:  CLINICAL IMPRESSION:   Manual for hip mobility with improvement in ROM and decrease in tissue tension. Continued with glute strengthening and additional functional strengthening. Some cueing required for mechanics with good/fair carry over. Patient will continue to benefit from physical therapy in order to improve function and reduce impairment.   OBJECTIVE IMPAIRMENTS: decreased activity tolerance, difficulty walking, decreased balance, decreased endurance, decreased mobility, decreased ROM, decreased strength, impaired flexibility, impaired UE/LE use, postural dysfunction, and pain.  ACTIVITY LIMITATIONS: bending, lifting, carry, locomotion, cleaning, community activity, driving, and or occupation  PERSONAL FACTORS: Arthritis, Obesity, Pre- diabetes are also affecting patient's functional outcome.  REHAB POTENTIAL: Good  CLINICAL DECISION MAKING: Stable/uncomplicated  EVALUATION COMPLEXITY: Low    GOALS: Short term PT Goals Target date: 10/13/2023 Pt will be I and compliant with HEP. Baseline:  Goal status: New Pt will decrease pain by 25% overall Baseline: Goal status: New  Long term PT goals Target date: 11/24/2023  1.  Pt will be able to demonstrate symmetrical PROM of the L hip  in order to demonstrate functional improvement in LE function for progression more normalized gait and community mobility.   Baseline:  Goal status: INITIAL 2. Pt will improve  hip/knee strength to at least 5-/5 MMT to improve functional strength Baseline: Goal status: New 3. Pt will improve LEFS to at least 11% functional to show improved function Baseline:32.5%  Goal status: New 4. Pt  will become independent with final HEP in order to demonstrate synthesis of PT education.     Baseline:      Goal status: INITIAL 5. Pt will be able to demonstrate full depth squat without pain in order to demonstrate functional improvement in LE function for self-care and house hold duties.      Baseline:  Goal status: INITIAL 6. Pt will reduce pain by overall 50% overall with usual activity Baseline: Goal status: New 7. Pt will be able to ambulate community distances at least 1000 ft WNL gait pattern without complaints Baseline: Goal status: New   PLAN: PT FREQUENCY: 1-2 times per week   PT DURATION: 6-8 weeks  PLANNED INTERVENTIONS (unless contraindicated): aquatic PT, Canalith repositioning, cryotherapy, Electrical stimulation, Iontophoresis with 4 mg/ml dexamethasome, Moist heat, traction, Ultrasound, gait training, Therapeutic exercise, balance training, neuromuscular re-education, patient/family education, prosthetic training, manual techniques, passive ROM, dry needling, taping, vasopnuematic device, vestibular, spinal manipulations, joint manipulations  PLAN FOR NEXT SESSION: Decrease pain, improve ROM, strengthen proximal LE muscles.     Prentice RAMAN Michial Disney, PT 10/20/2023, 2:29 PM

## 2023-10-27 ENCOUNTER — Ambulatory Visit (HOSPITAL_BASED_OUTPATIENT_CLINIC_OR_DEPARTMENT_OTHER): Payer: Self-pay | Admitting: Physical Therapy

## 2023-10-27 ENCOUNTER — Encounter (HOSPITAL_BASED_OUTPATIENT_CLINIC_OR_DEPARTMENT_OTHER): Payer: Self-pay | Admitting: Physical Therapy

## 2023-10-27 DIAGNOSIS — M25551 Pain in right hip: Secondary | ICD-10-CM

## 2023-10-27 DIAGNOSIS — M6281 Muscle weakness (generalized): Secondary | ICD-10-CM

## 2023-10-27 DIAGNOSIS — R262 Difficulty in walking, not elsewhere classified: Secondary | ICD-10-CM

## 2023-10-27 NOTE — Therapy (Signed)
 OUTPATIENT PHYSICAL THERAPY LOWER EXTREMITY EVALUATION   Patient Name: Samuel Campbell MRN: [id number] DOB:October 11, 1975, 48 y.o., male Today's Date: 10/27/2023  END OF SESSION:  PT End of Session - 10/27/23 0823     Visit Number 6    Number of Visits 12    Date for PT Re-Evaluation 11/24/23    Authorization Type AETNA    PT Start Time 0803    PT Stop Time 0843    PT Time Calculation (min) 40 min    Activity Tolerance Patient tolerated treatment well    Behavior During Therapy [facility] for tasks assessed/performed            Past Medical History:  Diagnosis Date   Arthritis    Asthma    COVID 10/04/2019   sob, sinus issues, weaknessx 4 days all symptoms resolved   Elevated LFTs    Fatty liver 09/16/2020   GERD (gastroesophageal reflux disease)    Hepatic steatosis    High blood pressure 09/16/2020   Obesity    Plantar fascial fibromatosis of right foot 09/16/2020   wears insert   Pre-diabetes 09/16/2020   early dm per dr norleen general lov 09-16-2020   Sleep apnea    uses cpap does not know settings   Symptomatic cholelithiasis 09/16/2020   no recent symptoms per pt   Tendinitis    wears boot on left ankle when sleeping   Wears glasses 09/16/2020   Past Surgical History:  Procedure Laterality Date   ANKLE SURGERY Left 02/11/2018   CARPAL TUNNEL RELEASE Right    2006 or 2007 or 2008   CHOLECYSTECTOMY N/A 09/19/2020   Procedure: LAPAROSCOPIC CHOLECYSTECTOMY WITH INTRAOPERATIVE CHOLANGIOGRAM;  Surgeon: Sheldon Standing, MD;  Location: [facility] [place];  Service: General;  Laterality: N/A;   FOOT SURGERY Left 02/11/2018   for plantar fascilits   LIVER BIOPSY N/A 09/19/2020   Procedure: NEEDLE CORE BIOPSY OF LIVER;  Surgeon: Sheldon Standing, MD;  Location: [facility] [place];  Service: General;  Laterality: N/A;   TONSILLECTOMY     age 79   WISDOM TOOTH EXTRACTION     age 47   Patient Active Problem List   Diagnosis Date Noted   Allergic rhinitis due to  animal (cat) (dog) hair and dander 08/12/2020   Chronic allergic conjunctivitis 08/12/2020   Gastro-esophageal reflux disease without esophagitis 08/12/2020   Mild persistent asthma, uncomplicated 08/12/2020   Obesity (BMI 30-39.9) 08/12/2020   Chronic cholecystitis 08/12/2020   Prediabetes 08/12/2020    PCP: general norleen, MD   REFERRING PROVIDER: Genelle Standing, MD   REFERRING DIAG: Pain in right hip [M25.551]   THERAPY DIAG:  Pain in right hip  Difficulty in walking, not elsewhere classified  Muscle weakness (generalized)  Rationale for Evaluation and Treatment: Rehabilitation  ONSET DATE: 6 months ago.   SUBJECTIVE:   SUBJECTIVE STATEMENT: Pt reports feels like it is getting worse. Feels better in the moment but pain then when doing something. Pain right at the hip bone  PERTINENT HISTORY: Arthritis, Obesity, Pre- diabetes.  PAIN:  Are you having pain? Yes: NPRS scale: 6/10, worst: 7-8/10 Pain location: R hip pain, into buttocks.  Pain description: Dull ache.  Aggravating factors: Sitting, driving, squatting, stairs.  Relieving factors: Ice, meds, cushion.   PRECAUTIONS: None  RED FLAGS: None   WEIGHT BEARING RESTRICTIONS: No  FALLS:  Has patient fallen in last 6 months? No  LIVING ENVIRONMENT: Lives with: lives with their family Lives in: House/apartment Stairs: Yes:  External: 5 steps; bilateral but cannot reach both Has following equipment at home: None  OCCUPATION: Supervisor of the state.   PLOF: Independent  PATIENT GOALS: Pt would to be able to reduce pain to drive comfortably.   NEXT MD VISIT: 10/20/2023  OBJECTIVE:  Note: Objective measures were completed at Evaluation unless otherwise noted.  DIAGNOSTIC FINDINGS: Mild insertional tendinopathy of the gluteus medius and minimus on the right.   Small nondisplaced superior labral tear with images given above.  PATIENT SURVEYS:  Lower Extremity Functional Score: 26 / 80 = 32.5  %   COGNITION: Overall cognitive status: Within functional limits for tasks assessed     SENSATION: WFL  POSTURE: No Significant postural limitations  PALPATION: Tenderness surrounding hip   LOWER EXTREMITY ROM:  Active ROM Right eval Left eval  Hip flexion 76 100*  Hip extension    Hip abduction    Hip adduction    Hip internal rotation 3 0*  Hip external rotation 45 32*  Knee flexion [facility] WFL  Knee extension WFL WFL   (Blank rows = not tested)  LOWER EXTREMITY MMT:  MMT Right eval Left eval  Hip flexion 4 5  Knee flexion 4 5  Knee extension 4 5   (Blank rows = not tested)  LOWER EXTREMITY SPECIAL TESTS:  Hip special tests: Hip scouring test: positive   FUNCTIONAL TESTS:  5 times sit to stand: 15.14 sec   GAIT: Distance walked: 40ft  Assistive device utilized: None Level of assistance: Complete Independence Comments: Slight antalgic gait                                                                                                                                 TREATMENT DATE:   8/20  Mulligan hip mobs inf and lateral grade III LAD with belt 5 min  Bridge 2x10 Figure 4 LTR 20x RTB clamshell iso 5s 2x10 RTB STS 3x8  Quadratus stretch 30s 2x each side 6 runner step up 3x8   10/20/23 Supine piriformis stretch 5 x 20 second holds Manual: STM/TPR to hip flexors, belt lateral traction mobs Hip hike of step 2 x 10 Step up with contralateral knee drive 4 inch 1 x 20, 1 x 15   10/12/23 Manual: STM to R posterior hip/deep hip rotators; prone hip PA glide in progressive ER/IR Grade II Prone hip extension 2 x 10  Standing hip abduction RTB at knees 2 x 10 STS 2 x 10   7/31  STM R posterior hip/deep hip rotators   Supine hip ABD iso 5s 2x10 Prone quad stretch 30s 3x Chidl's pose 10x  Edu regardig conservatve vs surgical management, symptoms management, acceptable levels of pain   PATIENT EDUCATION:  Education details: diagnosis,  prognosis, anatomy, exercise progression, DOMS expectations, muscle firing,  envelope of function, HEP, POC  Person educated: Patient Education method: Explanation, Demonstration, Tactile cues, and Verbal cues Education comprehension: verbalized understanding, returned  demonstration, verbal cues required, and tactile cues required  HOME EXERCISE PROGRAM: Access Code: [email or web address] URL: [email or web address] Date: 09/29/2023 Prepared by: Rojean Batten  Exercises - Supine Bridge  - 1-2 x daily - 7 x weekly - 2 sets - 10 reps - Supine Quad Set  - 1-3 x daily - 7 x weekly - 2 sets - 10 reps - 3-5 hold - Seated Long Arc Quad  - 1-3 x daily - 7 x weekly - 2 sets - 10 reps - Seated Hamstring Stretch  - 1-2 x daily - 7 x weekly - 2 sets - 10 reps   ASSESSMENT:  CLINICAL IMPRESSION:  Pt with good tolerance to manual mobilizations with report of reduction of stiffness by end of session. Pt HEP trimmed to aid in compliance. CKC exercise progressed today without pain but pt does cramp often in bilat LE from fatiguing with SL activity. Plan to continue with joint mobility, pain management, and strength as pt is possibly preparing for surgery. Patient will continue to benefit from physical therapy in order to improve function and reduce impairment.   OBJECTIVE IMPAIRMENTS: decreased activity tolerance, difficulty walking, decreased balance, decreased endurance, decreased mobility, decreased ROM, decreased strength, impaired flexibility, impaired UE/LE use, postural dysfunction, and pain.  ACTIVITY LIMITATIONS: bending, lifting, carry, locomotion, cleaning, community activity, driving, and or occupation  PERSONAL FACTORS: Arthritis, Obesity, Pre- diabetes are also affecting patient's functional outcome.  REHAB POTENTIAL: Good  CLINICAL DECISION MAKING: Stable/uncomplicated  EVALUATION COMPLEXITY: Low    GOALS: Short term PT Goals Target date: 10/13/2023 Pt will be I and compliant  with HEP. Baseline:  Goal status: New Pt will decrease pain by 25% overall Baseline: Goal status: New  Long term PT goals Target date: 11/24/2023  1.  Pt will be able to demonstrate symmetrical PROM of the L hip  in order to demonstrate functional improvement in LE function for progression more normalized gait and community mobility.  Baseline:  Goal status: INITIAL 2. Pt will improve  hip/knee strength to at least 5-/5 MMT to improve functional strength Baseline: Goal status: New 3. Pt will improve LEFS to at least 11% functional to show improved function Baseline:32.5%  Goal status: New 4. Pt  will become independent with final HEP in order to demonstrate synthesis of PT education.     Baseline:      Goal status: INITIAL 5. Pt will be able to demonstrate full depth squat without pain in order to demonstrate functional improvement in LE function for self-care and house hold duties.      Baseline:  Goal status: INITIAL 6. Pt will reduce pain by overall 50% overall with usual activity Baseline: Goal status: New 7. Pt will be able to ambulate community distances at least 1000 ft WNL gait pattern without complaints Baseline: Goal status: New   PLAN: PT FREQUENCY: 1-2 times per week   PT DURATION: 6-8 weeks  PLANNED INTERVENTIONS (unless contraindicated): aquatic PT, Canalith repositioning, cryotherapy, Electrical stimulation, Iontophoresis with 4 mg/ml dexamethasome, Moist heat, traction, Ultrasound, gait training, Therapeutic exercise, balance training, neuromuscular re-education, patient/family education, prosthetic training, manual techniques, passive ROM, dry needling, taping, vasopnuematic device, vestibular, spinal manipulations, joint manipulations  PLAN FOR NEXT SESSION: Decrease pain, improve ROM, strengthen proximal LE muscles.     Dale Call, PT 10/27/2023, 8:47 AM

## 2023-11-01 ENCOUNTER — Encounter (HOSPITAL_BASED_OUTPATIENT_CLINIC_OR_DEPARTMENT_OTHER): Payer: Self-pay | Admitting: Physical Therapy

## 2023-11-03 ENCOUNTER — Encounter (HOSPITAL_BASED_OUTPATIENT_CLINIC_OR_DEPARTMENT_OTHER): Payer: Self-pay | Admitting: Physical Therapy

## 2023-11-03 ENCOUNTER — Ambulatory Visit (HOSPITAL_BASED_OUTPATIENT_CLINIC_OR_DEPARTMENT_OTHER): Payer: Self-pay | Admitting: Physical Therapy

## 2023-11-03 ENCOUNTER — Other Ambulatory Visit (HOSPITAL_BASED_OUTPATIENT_CLINIC_OR_DEPARTMENT_OTHER): Payer: Self-pay | Admitting: Orthopaedic Surgery

## 2023-11-03 DIAGNOSIS — R262 Difficulty in walking, not elsewhere classified: Secondary | ICD-10-CM

## 2023-11-03 DIAGNOSIS — M25551 Pain in right hip: Secondary | ICD-10-CM

## 2023-11-03 DIAGNOSIS — M6281 Muscle weakness (generalized): Secondary | ICD-10-CM

## 2023-11-03 NOTE — Therapy (Signed)
 OUTPATIENT PHYSICAL THERAPY LOWER EXTREMITY EVALUATION   Patient Name: Samuel Campbell MRN: [id number] DOB:1976-01-27, 48 y.o., male Today's Date: 11/03/2023  END OF SESSION:  PT End of Session - 11/03/23 1019     Visit Number 7    Number of Visits 12    Date for PT Re-Evaluation 11/24/23    Authorization Type AETNA    PT Start Time 1022    PT Stop Time 1100    PT Time Calculation (min) 38 min    Activity Tolerance Patient tolerated treatment well    Behavior During Therapy WFL for tasks assessed/performed            Past Medical History:  Diagnosis Date   Arthritis    Asthma    COVID 10/04/2019   sob, sinus issues, weaknessx 4 days all symptoms resolved   Elevated LFTs    Fatty liver 09/16/2020   GERD (gastroesophageal reflux disease)    Hepatic steatosis    High blood pressure 09/16/2020   Obesity    Plantar fascial fibromatosis of right foot 09/16/2020   wears insert   Pre-diabetes 09/16/2020   early dm per dr norleen general lov 09-16-2020   Sleep apnea    uses cpap does not know settings   Symptomatic cholelithiasis 09/16/2020   no recent symptoms per pt   Tendinitis    wears boot on left ankle when sleeping   Wears glasses 09/16/2020   Past Surgical History:  Procedure Laterality Date   ANKLE SURGERY Left 02/11/2018   CARPAL TUNNEL RELEASE Right    2006 or 2007 or 2008   CHOLECYSTECTOMY N/A 09/19/2020   Procedure: LAPAROSCOPIC CHOLECYSTECTOMY WITH INTRAOPERATIVE CHOLANGIOGRAM;  Surgeon: Sheldon Standing, MD;  Location: [facility];  Service: General;  Laterality: N/A;   FOOT SURGERY Left 02/11/2018   for plantar fascilits   LIVER BIOPSY N/A 09/19/2020   Procedure: NEEDLE CORE BIOPSY OF LIVER;  Surgeon: Sheldon Standing, MD;  Location: [facility] [place];  Service: General;  Laterality: N/A;   TONSILLECTOMY     age 63   WISDOM TOOTH EXTRACTION     age 32   Patient Active Problem List   Diagnosis Date Noted   Allergic rhinitis due to  animal (cat) (dog) hair and dander 08/12/2020   Chronic allergic conjunctivitis 08/12/2020   Gastro-esophageal reflux disease without esophagitis 08/12/2020   Mild persistent asthma, uncomplicated 08/12/2020   Obesity (BMI 30-39.9) 08/12/2020   Chronic cholecystitis 08/12/2020   Prediabetes 08/12/2020    PCP: general norleen, MD   REFERRING PROVIDER: Genelle Standing, MD   REFERRING DIAG: Pain in right hip [M25.551]   THERAPY DIAG:  Pain in right hip  Difficulty in walking, not elsewhere classified  Muscle weakness (generalized)  Rationale for Evaluation and Treatment: Rehabilitation  ONSET DATE: 6 months ago.   SUBJECTIVE:   SUBJECTIVE STATEMENT: Pt reports hip is about the same. Pain continues to be constant but a little less at like a 3/10.  PERTINENT HISTORY: Arthritis, Obesity, Pre- diabetes.  PAIN:  Are you having pain? Yes: NPRS scale: 3/10, worst: 7-8/10 Pain location: R hip pain, into buttocks.  Pain description: Dull ache.  Aggravating factors: Sitting, driving, squatting, stairs.  Relieving factors: Ice, meds, cushion.   PRECAUTIONS: None  RED FLAGS: None   WEIGHT BEARING RESTRICTIONS: No  FALLS:  Has patient fallen in last 6 months? No  LIVING ENVIRONMENT: Lives with: lives with their family Lives in: House/apartment Stairs: Yes: External: 5 steps; bilateral but  cannot reach both Has following equipment at home: None  OCCUPATION: Supervisor of the state.   PLOF: Independent  PATIENT GOALS: Pt would to be able to reduce pain to drive comfortably.   NEXT MD VISIT: 10/20/2023  OBJECTIVE:  Note: Objective measures were completed at Evaluation unless otherwise noted.  DIAGNOSTIC FINDINGS: Mild insertional tendinopathy of the gluteus medius and minimus on the right.   Small nondisplaced superior labral tear with images given above.  PATIENT SURVEYS:  Lower Extremity Functional Score: 26 / 80 = 32.5 %   COGNITION: Overall cognitive  status: Within functional limits for tasks assessed     SENSATION: WFL  POSTURE: No Significant postural limitations  PALPATION: Tenderness surrounding hip   LOWER EXTREMITY ROM:  Active ROM Right eval Left eval  Hip flexion 76 100*  Hip extension    Hip abduction    Hip adduction    Hip internal rotation 3 0*  Hip external rotation 45 32*  Knee flexion [facility] WFL  Knee extension WFL WFL   (Blank rows = not tested)  LOWER EXTREMITY MMT:  MMT Right eval Left eval  Hip flexion 4 5  Knee flexion 4 5  Knee extension 4 5   (Blank rows = not tested)  LOWER EXTREMITY SPECIAL TESTS:  Hip special tests: Hip scouring test: positive   FUNCTIONAL TESTS:  5 times sit to stand: 15.14 sec   GAIT: Distance walked: 73ft  Assistive device utilized: None Level of assistance: Complete Independence Comments: Slight antalgic gait                                                                                                                                 TREATMENT DATE:  11/03/23 Bridge with clam RTB 2 x 10 RTB STS 3x10  Shuttle DL 24# 2 x 10, SL 49# 2 x 10, sidelying 50# 2 x 10  Step up with contralateral knee drive 6 inch 3 x 10 Standing hip adductor stretch 3 x 15 second holds  8/20  Mulligan hip mobs inf and lateral grade III LAD with belt 5 min  Bridge 2x10 Figure 4 LTR 20x RTB clamshell iso 5s 2x10 RTB STS 3x8  Quadratus stretch 30s 2x each side 6 runner step up 3x8   10/20/23 Supine piriformis stretch 5 x 20 second holds Manual: STM/TPR to hip flexors, belt lateral traction mobs Hip hike of step 2 x 10 Step up with contralateral knee drive 4 inch 1 x 20, 1 x 15   10/12/23 Manual: STM to R posterior hip/deep hip rotators; prone hip PA glide in progressive ER/IR Grade II Prone hip extension 2 x 10  Standing hip abduction RTB at knees 2 x 10 STS 2 x 10   7/31  STM R posterior hip/deep hip rotators   Supine hip ABD iso 5s 2x10 Prone quad stretch 30s  3x Chidl's pose 10x  Edu regardig conservatve vs surgical management, symptoms management,  acceptable levels of pain   PATIENT EDUCATION:  Education details: diagnosis, prognosis, anatomy, exercise progression, DOMS expectations, muscle firing,  envelope of function, HEP, POC  Person educated: Patient Education method: Explanation, Demonstration, Tactile cues, and Verbal cues Education comprehension: verbalized understanding, returned demonstration, verbal cues required, and tactile cues required  HOME EXERCISE PROGRAM: Access Code: [email or web address] URL: [email or web address] Date: 09/29/2023 Prepared by: Rojean Batten  Exercises - Supine Bridge  - 1-2 x daily - 7 x weekly - 2 sets - 10 reps - Supine Quad Set  - 1-3 x daily - 7 x weekly - 2 sets - 10 reps - 3-5 hold - Seated Long Arc Quad  - 1-3 x daily - 7 x weekly - 2 sets - 10 reps - Seated Hamstring Stretch  - 1-2 x daily - 7 x weekly - 2 sets - 10 reps   ASSESSMENT:  CLINICAL IMPRESSION:  Continued with glute strengthening and hip mobility exercises which are tolerated well. Good mechanics after initial cueing and set up. Moderate fatigue at EOS.  Patient will continue to benefit from physical therapy in order to improve function and reduce impairment.   OBJECTIVE IMPAIRMENTS: decreased activity tolerance, difficulty walking, decreased balance, decreased endurance, decreased mobility, decreased ROM, decreased strength, impaired flexibility, impaired UE/LE use, postural dysfunction, and pain.  ACTIVITY LIMITATIONS: bending, lifting, carry, locomotion, cleaning, community activity, driving, and or occupation  PERSONAL FACTORS: Arthritis, Obesity, Pre- diabetes are also affecting patient's functional outcome.  REHAB POTENTIAL: Good  CLINICAL DECISION MAKING: Stable/uncomplicated  EVALUATION COMPLEXITY: Low    GOALS: Short term PT Goals Target date: 10/13/2023 Pt will be I and compliant with HEP. Baseline:  Goal  status: New Pt will decrease pain by 25% overall Baseline: Goal status: New  Long term PT goals Target date: 11/24/2023  1.  Pt will be able to demonstrate symmetrical PROM of the L hip  in order to demonstrate functional improvement in LE function for progression more normalized gait and community mobility.  Baseline:  Goal status: INITIAL 2. Pt will improve  hip/knee strength to at least 5-/5 MMT to improve functional strength Baseline: Goal status: New 3. Pt will improve LEFS to at least 11% functional to show improved function Baseline:32.5%  Goal status: New 4. Pt  will become independent with final HEP in order to demonstrate synthesis of PT education.     Baseline:      Goal status: INITIAL 5. Pt will be able to demonstrate full depth squat without pain in order to demonstrate functional improvement in LE function for self-care and house hold duties.      Baseline:  Goal status: INITIAL 6. Pt will reduce pain by overall 50% overall with usual activity Baseline: Goal status: New 7. Pt will be able to ambulate community distances at least 1000 ft WNL gait pattern without complaints Baseline: Goal status: New   PLAN: PT FREQUENCY: 1-2 times per week   PT DURATION: 6-8 weeks  PLANNED INTERVENTIONS (unless contraindicated): aquatic PT, Canalith repositioning, cryotherapy, Electrical stimulation, Iontophoresis with 4 mg/ml dexamethasome, Moist heat, traction, Ultrasound, gait training, Therapeutic exercise, balance training, neuromuscular re-education, patient/family education, prosthetic training, manual techniques, passive ROM, dry needling, taping, vasopnuematic device, vestibular, spinal manipulations, joint manipulations  PLAN FOR NEXT SESSION: Decrease pain, improve ROM, strengthen proximal LE muscles.     Prentice RAMAN Denaly Gatling, PT 11/03/2023, 10:57 AM

## 2023-11-10 ENCOUNTER — Encounter (HOSPITAL_BASED_OUTPATIENT_CLINIC_OR_DEPARTMENT_OTHER): Admitting: Physical Therapy

## 2023-11-17 ENCOUNTER — Encounter (HOSPITAL_BASED_OUTPATIENT_CLINIC_OR_DEPARTMENT_OTHER): Admitting: Physical Therapy

## 2023-11-25 ENCOUNTER — Other Ambulatory Visit (HOSPITAL_BASED_OUTPATIENT_CLINIC_OR_DEPARTMENT_OTHER): Payer: Self-pay | Admitting: Orthopaedic Surgery

## 2023-11-25 DIAGNOSIS — M25551 Pain in right hip: Secondary | ICD-10-CM

## 2023-12-29 ENCOUNTER — Ambulatory Visit: Admitting: Physical Therapy

## 2024-01-04 NOTE — Progress Notes (Signed)
 Chief Complaint: Right hip pain     History of Present Illness:   01/04/2024: Presents today for follow-up of his MRI.  He has now failed an injection for his right hip as well as physical therapy for 6 weeks  Samuel Campbell is a 48 y.o. male presents today with ongoing right hip pain which does radiate to the buttocks as well as to the front and lateral aspect of the hip.  Denies any injury.  He has having pain over the last several months which is worse with activity or even with sitting for longer periods of time.  Did have a fall directly on the side he has trialed anti-inflammatories.  He has not had any injections in the past.    PMH/PSH/Family History/Social History/Meds/Allergies:    Past Medical History:  Diagnosis Date   Arthritis    Asthma    COVID 10/04/2019   sob, sinus issues, weaknessx 4 days all symptoms resolved   Elevated LFTs    Fatty liver 09/16/2020   GERD (gastroesophageal reflux disease)    Hepatic steatosis    High blood pressure 09/16/2020   Obesity    Plantar fascial fibromatosis of right foot 09/16/2020   wears insert   Pre-diabetes 09/16/2020   early dm per dr norleen general lov 09-16-2020   Sleep apnea    uses cpap does not know settings   Symptomatic cholelithiasis 09/16/2020   no recent symptoms per pt   Tendinitis    wears boot on left ankle when sleeping   Wears glasses 09/16/2020   Past Surgical History:  Procedure Laterality Date   ANKLE SURGERY Left 02/11/2018   CARPAL TUNNEL RELEASE Right    2006 or 2007 or 2008   CHOLECYSTECTOMY N/A 09/19/2020   Procedure: LAPAROSCOPIC CHOLECYSTECTOMY WITH INTRAOPERATIVE CHOLANGIOGRAM;  Surgeon: Sheldon Standing, MD;  Location: [facility] [place];  Service: General;  Laterality: N/A;   FOOT SURGERY Left 02/11/2018   for plantar fascilits   LIVER BIOPSY N/A 09/19/2020   Procedure: NEEDLE CORE BIOPSY OF LIVER;  Surgeon: Sheldon Standing, MD;  Location: [facility] [place];  Service:  General;  Laterality: N/A;   TONSILLECTOMY     age 71   WISDOM TOOTH EXTRACTION     age 13   Social History   Socioeconomic History   Marital status: Married    Spouse name: Not on file   Number of children: Not on file   Years of education: Not on file   Highest education level: Not on file  Occupational History   Not on file  Tobacco Use   Smoking status: Former    Current packs/day: 0.00    Average packs/day: 1.5 packs/day for 15.0 years (22.5 ttl pk-yrs)    Types: Cigarettes    Start date: 08/21/1986    Quit date: 08/20/2001    Years since quitting: 22.3   Smokeless tobacco: Former    Quit date: 08/20/2001  Vaping Use   Vaping status: Never Used  Substance and Sexual Activity   Alcohol use: Not Currently   Drug use: Never   Sexual activity: Not Currently  Other Topics Concern   Not on file  Social History Narrative   Not on file   Social Drivers of Health   Financial Resource Strain: Not on file  Food Insecurity: Not on file  Transportation Needs: Not on file  Physical Activity: Not on file  Stress: Not on file  Social Connections: Not on file  Family History  Problem Relation Age of Onset   Hypertension Mother    Celiac disease Mother    Colon polyps Mother    Diabetes Father    Alzheimer's disease Maternal Grandmother    COPD Maternal Grandfather    Asthma Maternal Grandfather    Diabetes Paternal Grandmother    Heart disease Paternal Grandmother    Diabetes Paternal Grandfather    Cirrhosis Other        Great uncle   Colon cancer Neg Hx    Stomach cancer Neg Hx    Esophageal cancer Neg Hx    Pancreatic cancer Neg Hx    No Known Allergies Current Outpatient Medications  Medication Sig Dispense Refill   aspirin 81 MG chewable tablet Chew 81 mg by mouth daily.     budesonide-formoterol (SYMBICORT) 80-4.5 MCG/ACT inhaler Inhale 2 puffs into the lungs as needed.     Cholecalciferol (VITAMIN D3 PO) Take 1 tablet by mouth daily.     dexlansoprazole   (DEXILANT ) 60 MG capsule Take 1 capsule (60 mg total) by mouth daily. (Patient not taking: Reported on 09/29/2023) 90 capsule 3   dicyclomine  (BENTYL ) 20 MG tablet TAKE 1 TABLET BY MOUTH 4 TIMES DAILY BEFORE MEAL(S) AND AT BEDTIME (Patient not taking: Reported on 09/29/2023) 90 tablet 0   ELDERBERRY PO Take 1 tablet by mouth daily.     esomeprazole (NEXIUM) 20 MG packet Take 20 mg by mouth daily before breakfast.     fluticasone (FLONASE) 50 MCG/ACT nasal spray Place 1 spray into both nostrils 2 (two) times daily.     hydrochlorothiazide (HYDRODIURIL) 25 MG tablet Take 25 mg by mouth daily.     ipratropium (ATROVENT) 0.03 % nasal spray Place 1 spray into both nostrils as needed.  5   levocetirizine (XYZAL) 5 MG tablet Take 5 mg by mouth every evening.     losartan (COZAAR) 100 MG tablet Take 100 mg by mouth daily.     montelukast (SINGULAIR) 10 MG tablet Take 10 mg by mouth at bedtime.     Olopatadine HCl 0.2 % SOLN Place 1 drop into both eyes once a week. Pataday per pt on Monday and prn when takes contacts out     ondansetron  (ZOFRAN ) 4 MG tablet Take 1 tablet (4 mg total) by mouth every 8 (eight) hours as needed for nausea. (Patient not taking: Reported on 09/29/2023) 8 tablet 5   PROAIR HFA 108 (90 Base) MCG/ACT inhaler Inhale 2 puffs into the lungs as needed. Albuterol  2   traMADol  (ULTRAM ) 50 MG tablet Take 1-2 tablets (50-100 mg total) by mouth every 6 (six) hours as needed for moderate pain or severe pain. 20 tablet 0   vitamin C (ASCORBIC ACID) 500 MG tablet Take 1 tablet by mouth daily.     vitamin E 180 MG (400 UNITS) capsule Take 800 Units by mouth daily.     No current facility-administered medications for this visit.   No results found.  Review of Systems:   A ROS was performed including pertinent positives and negatives as documented in the HPI.  Physical Exam :   Constitutional: NAD and appears stated age Neurological: Alert and oriented Psych: Appropriate affect and  cooperative There were no vitals taken for this visit.   Comprehensive Musculoskeletal Exam:    Right hip with tenderness about the femoral acetabular joint with positive FADIR and 30 degrees internal rotation with 30 degrees external rotation relieving the pain.  Distal neurosensory exam is intact.  No weakness with hip abduction and normal gait   Imaging:   Xray (4 views right hip): Normal  MRI right hip: Anterior superior labral tear without evidence of arthritis or chondral loss  I personally reviewed and interpreted the radiographs.   Assessment and Plan:   48 y.o. male with right hip pain consistent with a right hip labral tear.  At this time he has had 2 injections which did give him temporary and significant relief.  He is not getting permanent relief with physical therapy and as result we did briefly discuss hip arthroscopy with labral repair.  He did have a fall 3 months prior at his initial injury and since this time has been having mechanical symptoms with popping and clicking.  He has now failed an injection in his right hip as well as 6 weeks of supervised physical therapy.  Given this I do believe he will ultimately benefit from right hip arthroscopy with labral pair   -Plan for right hip arthroscopy with labral repair   After a lengthy discussion of treatment options, including risks, benefits, alternatives, complications of surgical and nonsurgical conservative options, the patient elected surgical repair.   The patient  is aware of the material risks  and complications including, but not limited to injury to adjacent structures, neurovascular injury, infection, numbness, bleeding, implant failure, thermal burns, stiffness, persistent pain, failure to heal, disease transmission from allograft, need for further surgery, dislocation, anesthetic risks, blood clots, risks of death,and others. The probabilities of surgical success and failure discussed with patient given their  particular co-morbidities.The time and nature of expected rehabilitation and recovery was discussed.The patient's questions were all answered preoperatively.  No barriers to understanding were noted. I explained the natural history of the disease process and Rx rationale.  I explained to the patient what I considered to be reasonable expectations given their personal situation.  The final treatment plan was arrived at through a shared patient decision making process model.       I personally saw and evaluated the patient, and participated in the management and treatment plan.  Elspeth Parker, MD Attending Physician, Orthopedic Surgery  This document was dictated using Dragon voice recognition software. A reasonable attempt at proof reading has been made to minimize errors.

## 2024-01-07 ENCOUNTER — Ambulatory Visit (INDEPENDENT_AMBULATORY_CARE_PROVIDER_SITE_OTHER): Admitting: Orthopaedic Surgery

## 2024-01-07 DIAGNOSIS — M25551 Pain in right hip: Secondary | ICD-10-CM

## 2024-01-10 ENCOUNTER — Encounter: Payer: Self-pay | Admitting: Radiology

## 2024-01-31 ENCOUNTER — Encounter (HOSPITAL_BASED_OUTPATIENT_CLINIC_OR_DEPARTMENT_OTHER): Payer: Self-pay | Admitting: Orthopaedic Surgery

## 2024-03-16 ENCOUNTER — Encounter (HOSPITAL_BASED_OUTPATIENT_CLINIC_OR_DEPARTMENT_OTHER): Payer: Self-pay | Admitting: Orthopaedic Surgery

## 2024-03-16 ENCOUNTER — Other Ambulatory Visit (HOSPITAL_BASED_OUTPATIENT_CLINIC_OR_DEPARTMENT_OTHER): Payer: Self-pay | Admitting: Orthopaedic Surgery

## 2024-03-16 DIAGNOSIS — S73191A Other sprain of right hip, initial encounter: Secondary | ICD-10-CM | POA: Diagnosis not present

## 2024-03-16 MED ORDER — IBUPROFEN 800 MG PO TABS: 800.0000 mg | ORAL_TABLET | Freq: Three times a day (TID) | ORAL | 0 refills | Status: AC

## 2024-03-16 MED ORDER — ACETAMINOPHEN 500 MG PO TABS: 500.0000 mg | ORAL_TABLET | Freq: Three times a day (TID) | ORAL | 0 refills | Status: AC

## 2024-03-16 MED ORDER — OXYCODONE HCL 5 MG PO TABS: 5.0000 mg | ORAL_TABLET | ORAL | 0 refills | Status: AC | PRN

## 2024-03-16 MED ORDER — ASPIRIN 325 MG PO TBEC: 325.0000 mg | DELAYED_RELEASE_TABLET | Freq: Every day | ORAL | 0 refills | Status: AC

## 2024-03-16 NOTE — Progress Notes (Signed)
 "  Date of Surgery: 03/16/2024  INDICATIONS: [name] is a 49 y.o.-year-old male with right hip labral tear.  The risk and benefits of the procedure were discussed in detail and documented in the pre-operative evaluation.   PREOPERATIVE DIAGNOSIS: 1. Right hip labral tear  POSTOPERATIVE DIAGNOSIS: Same.  PROCEDURE: 1. Right hip labral repair  SURGEON: Samuel LITTIE Parker MD  ASSISTANT: Conley Dawson, ATC  ANESTHESIA:  general  IV FLUIDS AND URINE: See anesthesia record.  ANTIBIOTICS: Ancef  ESTIMATED BLOOD LOSS: 10 mL.  IMPLANTS:  * No surgical log found *  DRAINS: None  CULTURES: None  COMPLICATIONS: none  DESCRIPTION OF PROCEDURE:   Cartilage Intact femoral and acetabular cartilage   Labrum Hypoplastic/frayed appearing with rug sign   Boundaries of labral tear Convention (3 o'clock anterior, 9 o'clock posterior) Anterior boundary: 3 o'clock Posterior boundary: 1 o'clock   OPERATIVE REPORT:  The patient was brought to the operating room, placed supine on the operating table, and bony prominences were padded.  The traction boots were applied with padding to ensure that safe traction could be applied through the feet.  The contralateral limb was abducted maximally and light traction was applied.  The operative leg was brought into neutral position.  The flouroscopic c-arm was brought between the legs for an AP image.  The patient was prepped and draped in a sterile fashion.  Time-out was performed and landmarks were identified. Traction was obtained and care was taken to ensure the least amount of force necessary to allow safe access to the joint of 8-42mm.  This was checked with fluoroscopy.    Next we placed an anterolateral portal under the assistance of fluoroscopy.  First, fluoroscopy was used to estimate the trajectory and starting point.  A 5mm incision with a #11 blade was made and a straight hemostat was used to dilate the portal through the appropriate tract.  We  then placed a 14-gauge hypodermic needle with careful technique to be as close to the femoral head as possible and parallel to the sorcele to ensure no iatrogenic damage to the labrum.  This released the negative pressure environment and the amount of traction was adjusted to maintain the 8-34mm of distraction.  A nitinol wire was placed through the needle and flouroscopy was used to ensure it extended to the medial wall of the acetabulum.  The Flowport from [employer] was placed over the wire and the nitinol wire was retracted to just inside the capsule during insertion of the dilator and cannula to minimize the risk of breakage. The arthroscope was placed next and we visualized the anterior triangle.     We then placed the anterior portal under direct visualization using the technique described above.  This was safely placed as well without damage to the labrum or femoral head.  We then switched our arthroscope to the anterior portal to ensure we were not through the labrum - we were safely through the capsule only.  We then proceeded with periportal capsulotomies utilizing the Samurai blade in each portal without connecting the two.  We identified the anterior inferior iliac spine proximally, the psoas tendon medially and the rectus tendon laterally as landmarks.  We then proceeded with a diagnostic arthroscopy - the results can be found in the findings section above.    We then used the radiofrequency device to clear the superior acetabulum and expose the subspinous region.  Next we exposed the acetabular rim leaving the chondral labral junction intact.  When adequate reshaping was obtained we then proceeded with the labral repair. We placed 2 anchors at the 1:00 and 2:30 positions. The sutures were passed using the crescent Nanopass from [employer].  This resulted in anatomic labral repair.  We debrided the loose cartilage at the rim and residual degenerative labral tissue.  Traction was let  down with total traction time of 25 minutes.     Finally, we performed a complete capsular closure with tape suture.  She was replaced in the anterior and posterior limb of the reported capsulotomy with excellent apposition. We then removed the arthroscope and closed the incisions with 3-0 nylon simple stitches.  A sterile dressing was applied..  The patient was awakened from anesthesia and transferred to PACU in stable condition. Postoperative care includes:       POSTOPERATIVE PLAN:    Weight bearing as tolerated operative extremity Formal physical therapy will begin immediately within the first weeks of surgery ASA 325 Daily for DVT prophylaxis     Samuel LITTIE Parker, MD 12:52 PM    "

## 2024-03-20 NOTE — Therapy (Unsigned)
 " OUTPATIENT PHYSICAL THERAPY EVALUATION   Patient Name: Samuel Campbell MRN: [id number] DOB:Jun 18, 1975, 49 y.o., male Today's Date: 03/21/2024  END OF SESSION:  PT End of Session - 03/21/24 0805     Visit Number 1    Number of Visits 12    Date for Recertification  05/16/24    Authorization Type AETNA    PT Start Time 0800    PT Stop Time 0840    PT Time Calculation (min) 40 min    Activity Tolerance Patient tolerated treatment well    Behavior During Therapy [facility] for tasks assessed/performed          Past Medical History:  Diagnosis Date   Arthritis    Asthma    COVID 10/04/2019   sob, sinus issues, weaknessx 4 days all symptoms resolved   Elevated LFTs    Fatty liver 09/16/2020   GERD (gastroesophageal reflux disease)    Hepatic steatosis    High blood pressure 09/16/2020   Obesity    Plantar fascial fibromatosis of right foot 09/16/2020   wears insert   Pre-diabetes 09/16/2020   early dm per dr norleen general lov 09-16-2020   Sleep apnea    uses cpap does not know settings   Symptomatic cholelithiasis 09/16/2020   no recent symptoms per pt   Tendinitis    wears boot on left ankle when sleeping   Wears glasses 09/16/2020   Past Surgical History:  Procedure Laterality Date   ANKLE SURGERY Left 02/11/2018   CARPAL TUNNEL RELEASE Right    2006 or 2007 or 2008   CHOLECYSTECTOMY N/A 09/19/2020   Procedure: LAPAROSCOPIC CHOLECYSTECTOMY WITH INTRAOPERATIVE CHOLANGIOGRAM;  Surgeon: Sheldon Standing, MD;  Location: [facility];  Service: General;  Laterality: N/A;   FOOT SURGERY Left 02/11/2018   for plantar fascilits   LIVER BIOPSY N/A 09/19/2020   Procedure: NEEDLE CORE BIOPSY OF LIVER;  Surgeon: Sheldon Standing, MD;  Location: [facility] [place];  Service: General;  Laterality: N/A;   TONSILLECTOMY     age 75   WISDOM TOOTH EXTRACTION     age 58   Patient Active Problem List   Diagnosis Date Noted   Allergic rhinitis due to animal (cat) (dog)  hair and dander 08/12/2020   Chronic allergic conjunctivitis 08/12/2020   Gastro-esophageal reflux disease without esophagitis 08/12/2020   Mild persistent asthma, uncomplicated 08/12/2020   Obesity (BMI 30-39.9) 08/12/2020   Chronic cholecystitis 08/12/2020   Prediabetes 08/12/2020    PCP: General Norleen, MD   REFERRING PROVIDER:   Genelle Standing, MD    REFERRING DIAG: [phone] (ICD-10-CM) - Pain in right hip   Rationale for Evaluation and Treatment:  Rehabiliation  THERAPY DIAG:  Pain in right hip  Difficulty in walking, not elsewhere classified  Muscle weakness (generalized)  ONSET DATE: post op labral repair 03/16/24   SUBJECTIVE:  SUBJECTIVE STATEMENT: He relays having hip surgery, doing well after, not having too much pain and was told he can be full weight bearing without crutches.   NEXT MD VISIT: 03/30/24  PERTINENT HISTORY:  See above PMH, post op labral repair 03/16/24  PAIN:  NPRS scale: 4/10 upon arrival Pain location:anterior-lateral right hip Pain description: intermittent achy,itchy Aggravating factors: sitting down, standing or walking too much Relieving factors: rest, meds   PRECAUTIONS: ,    RED FLAGS: None   WEIGHT BEARING RESTRICTIONS:  Yes WBAT on Rt LE  FALLS:  Has patient fallen in last 6 months? 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   PLOF:  Independent  PATIENT GOALS:  Return to work in 4 weeks if possible, no pain  OBJECTIVE:  Note: Objective measures were completed at Evaluation unless otherwise noted.  DIAGNOSTIC FINDINGS:    PATIENT SURVEYS:  Patient-Specific Activity Scoring Scheme  0 represents unable to perform. 10 represents able to perform at prior  level. 0 1 2 3 4 5 6 7 8 9  10 (Date and Score)   Activity Eval     1. Standing 30 minutes  8    2. Driving for one hour 0    3. Sitting for church service 4   4.    5.    Score 4    Total score = sum of the activity scores/number of activities Minimum detectable change (90%CI) for average score = 2 points Minimum detectable change (90%CI) for single activity score = 3 points    GAIT: Eval Assistive device utilized: None Level of assistance: Complete Independence Comments: mild limp, trendelenburg on R LE    LOWER EXTREMITY ROM:     PROM  Right eval Left eval  Hip flexion 90   Hip extension    Hip abduction    Hip adduction    Hip internal rotation 10 22  Hip external rotation 20   Knee flexion    Knee extension Post-op: N/A   Ankle dorsiflexion    Ankle plantarflexion    Ankle inversion    Ankle eversion     (Blank rows = not tested)   LOWER EXTREMITY MMT:    MMT in sitting Right eval Left eval  Hip flexion Not tested due to post op precautions   Hip extension    Hip abduction 4   Hip adduction 4   Hip internal rotation    Hip external rotation    Knee flexion 5   Knee extension 5   Ankle dorsiflexion    Ankle plantarflexion    Ankle inversion    Ankle eversion     (Blank rows = not tested)   FUNCTIONAL TESTS:  Eval 5 times sit to stand: 15.15 seconds without UE support, less weight shift to R LE  TREATMENT DATE:  Eval HEP creation and review with demonstration and trial set preformed, see below for details Selfcare:see education section below    PATIENT EDUCATION: Education details: HEP, PT plan of care, selfcare (discussed healing times, post op precautions, activity limitations) Person educated: Patient Education method: Explanation, Demonstration, Verbal cues, and Handouts Education comprehension: verbalized  understanding, further education recommended   HOME EXERCISE PROGRAM: Access Code: [email or web address] URL: [email or web address] Date: 03/21/2024 Prepared by: Redell Moose  Exercises - Standing March with Counter Support  - 2 x daily - 6 x weekly - 1 sets - 10 reps - Standing Hip Abduction with Counter Support  - 2 x daily - 6 x weekly - 1 sets - 10 reps - Single Leg Stance  - 2 x daily - 6 x weekly - 1 sets - 3 reps - 10 sec hold - Sit to Stand  - 2 x daily - 6 x weekly - 1-2 sets - 5 reps - Hooklying Single Knee to Chest Stretch  - 2 x daily - 6 x weekly - 1 sets - 5 reps - 10 sec hold - Supine Bilateral Hip Internal Rotation Stretch  - 2 x daily - 6 x weekly - 1 sets - 5 reps - 10 sec hold - Bent Knee Fallouts  - 2 x daily - 6 x weekly - 1 sets - 5 reps - 10 sec hold  ASSESSMENT:  CLINICAL IMPRESSION: Patient referred to PT for post op labral repair 03/16/24. Overall he is doing very well up to this point and ambulates in without brace or assistive device and states he was told he did not have to use these post op. Patient will benefit from skilled PT to improve overall function and to address impairments and limitations listed below.  OBJECTIVE IMPAIRMENTS: decreased activity tolerance for ADL's, difficulty walking, decreased balance, decreased endurance, decreased mobility, decreased ROM, decreased strength, impaired flexibility, impaired LE use, and pain.  ACTIVITY LIMITATIONS: bending, liftting, walking, standing, cleaning, community activity, driving, occupation  PERSONAL FACTORS: see above PMH  also affecting patient's functional outcome.  REHAB POTENTIAL: Excellent  CLINICAL DECISION MAKING: Stable/uncomplicated  EVALUATION COMPLEXITY: Low    GOALS: Short term PT Goals Target date: 04/18/2024   Pt will be I and compliant with HEP. Baseline:  Goal status: New Pt will decrease pain by 25% overall Baseline: Goal status: New  Long term PT goals Target  date:05/16/2024   Pt will improve R hip AROM to [facility] to improve functional mobility Baseline: Goal status: New Pt will improve R LE strength to at least 4+/5 MMT to improve functional strength Baseline: Goal status: New Pt will improve Patient specific functional scale (PSFS) to at least 9/10 to show improved function level Baseline: Goal status: New Pt will reduce pain to overall less than 3/10 with usual activity and work activity. Baseline: Goal status: New Pt will be able to ambulate community distances at least 500 ft WNL gait pattern without complaints Baseline: Goal status: New Pt will improve 5 times sit to stand to less than 12 seconds to show improved strength, balance.  Baseline: Goal status: New  PLAN: PT FREQUENCY: 1-3 times per week   PT DURATION: 6-8 weeks  PLANNED INTERVENTIONS  97110-Therapeutic exercises, 97530- Therapeutic activity, [id number]- Neuromuscular re-education, 97535- Self Care, [id number]- Manual therapy, and Patient/Family education  PLAN FOR NEXT SESSION: review and update HEP PRN, use Dr. Genelle protocol. Limit until 04/06/24: flexion to 90, abd to 30, avoid extension, IR to 30, ER to  20   Redell JONELLE Moose, PT,DPT 03/21/2024, 8:50 AM   "

## 2024-03-21 ENCOUNTER — Ambulatory Visit: Attending: Orthopaedic Surgery | Admitting: Physical Therapy

## 2024-03-21 ENCOUNTER — Encounter: Payer: Self-pay | Admitting: Physical Therapy

## 2024-03-21 DIAGNOSIS — M25551 Pain in right hip: Secondary | ICD-10-CM | POA: Insufficient documentation

## 2024-03-21 DIAGNOSIS — M6281 Muscle weakness (generalized): Secondary | ICD-10-CM | POA: Diagnosis present

## 2024-03-21 DIAGNOSIS — R262 Difficulty in walking, not elsewhere classified: Secondary | ICD-10-CM | POA: Diagnosis present

## 2024-03-23 ENCOUNTER — Ambulatory Visit: Admitting: Physical Therapy

## 2024-03-23 ENCOUNTER — Encounter: Payer: Self-pay | Admitting: Physical Therapy

## 2024-03-23 DIAGNOSIS — R262 Difficulty in walking, not elsewhere classified: Secondary | ICD-10-CM

## 2024-03-23 DIAGNOSIS — M6281 Muscle weakness (generalized): Secondary | ICD-10-CM

## 2024-03-23 DIAGNOSIS — M25551 Pain in right hip: Secondary | ICD-10-CM | POA: Diagnosis not present

## 2024-03-23 NOTE — Therapy (Addendum)
 " OUTPATIENT PHYSICAL THERAPY TREATMENT   Patient Name: Samuel Campbell MRN: [id number] DOB:1975-12-30, 49 y.o., male Today's Date: 03/23/2024  END OF SESSION:  PT End of Session - 03/23/24 1306     Visit Number 2    Number of Visits 12    Date for Recertification  05/16/24    Authorization Type AET[employer]    PT Start Time 1300    PT Stop Time 1338    PT Time Calculation (min) 38 min    Activity Tolerance Patient tolerated treatment well    Behavior During Therapy [facility] for tasks assessed/performed          Past Medical History:  Diagnosis Date   Arthritis    Asthma    COVID 10/04/2019   sob, sinus issues, weaknessx 4 days all symptoms resolved   Elevated LFTs    Fatty liver 09/16/2020   GERD (gastroesophageal reflux disease)    Hepatic steatosis    High blood pressure 09/16/2020   Obesity    Plantar fascial fibromatosis of right foot 09/16/2020   wears insert   Pre-diabetes 09/16/2020   early dm per dr norleen general lov 09-16-2020   Sleep apnea    uses cpap does not know settings   Symptomatic cholelithiasis 09/16/2020   no recent symptoms per pt   Tendinitis    wears boot on left ankle when sleeping   Wears glasses 09/16/2020   Past Surgical History:  Procedure Laterality Date   ANKLE SURGERY Left 02/11/2018   CARPAL TUNNEL RELEASE Right    2006 or 2007 or 2008   CHOLECYSTECTOMY N/A 09/19/2020   Procedure: LAPAROSCOPIC CHOLECYSTECTOMY WITH INTRAOPERATIVE CHOLANGIOGRAM;  Surgeon: Sheldon Standing, MD;  Location: [facility];  Service: General;  Laterality: N/A;   FOOT SURGERY Left 02/11/2018   for plantar fascilits   LIVER BIOPSY N/A 09/19/2020   Procedure: NEEDLE CORE BIOPSY OF LIVER;  Surgeon: Sheldon Standing, MD;  Location: [facility];  Service: General;  Laterality: N/A;   TONSILLECTOMY     age 28   WISDOM TOOTH EXTRACTION     age 68   Patient Active Problem List   Diagnosis Date Noted   Allergic rhinitis due to animal (cat) (dog) hair  and dander 08/12/2020   Chronic allergic conjunctivitis 08/12/2020   Gastro-esophageal reflux disease without esophagitis 08/12/2020   Mild persistent asthma, uncomplicated 08/12/2020   Obesity (BMI 30-39.9) 08/12/2020   Chronic cholecystitis 08/12/2020   Prediabetes 08/12/2020    PCP: General Norleen, MD   REFERRING PROVIDER:   Genelle Standing, MD    REFERRING DIAG: [phone] (ICD-10-CM) - Pain in right hip   Rationale for Evaluation and Treatment:  Rehabiliation  THERAPY DIAG:  Pain in right hip  Difficulty in walking, not elsewhere classified  Muscle weakness (generalized)  ONSET DATE: post op labral repair 03/16/24   SUBJECTIVE:  SUBJECTIVE STATEMENT: Relays some soreness but reports it feels better after the exercise today.  NEXT MD VISIT: 03/30/24  PERTINENT HISTORY:  See above PMH, post op labral repair 03/16/24  PAIN:  NPRS scale: 6/10 upon arrival Pain location:anterior-lateral right hip Pain description: intermittent achy,itchy Aggravating factors: sitting down, standing or walking too much Relieving factors: rest, meds   PRECAUTIONS: ,    RED FLAGS: None   WEIGHT BEARING RESTRICTIONS:  Yes WBAT on Rt LE  FALLS:  Has patient fallen in last 6 months? 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   PLOF:  Independent  PATIENT GOALS:  Return to work in 4 weeks if possible, no pain  OBJECTIVE:  Note: Objective measures were completed at Evaluation unless otherwise noted.  DIAGNOSTIC FINDINGS:    PATIENT SURVEYS:  Patient-Specific Activity Scoring Scheme  0 represents unable to perform. 10 represents able to perform at prior level. 0 1 2 3 4 5 6 7 8 9  10 (Date and Score)   Activity Eval      1. Standing 30 minutes  8    2. Driving for one hour 0    3. Sitting for church service 4   4.    5.    Score 4    Total score = sum of the activity scores/number of activities Minimum detectable change (90%CI) for average score = 2 points Minimum detectable change (90%CI) for single activity score = 3 points    GAIT: Eval Assistive device utilized: None Level of assistance: Complete Independence Comments: mild limp, trendelenburg on R LE    LOWER EXTREMITY ROM:     PROM  Right eval Left eval  Hip flexion 90   Hip extension    Hip abduction    Hip adduction    Hip internal rotation 10 22  Hip external rotation 20   Knee flexion    Knee extension Post-op: N/A   Ankle dorsiflexion    Ankle plantarflexion    Ankle inversion    Ankle eversion     (Blank rows = not tested)   LOWER EXTREMITY MMT:    MMT in sitting Right eval Left eval  Hip flexion Not tested due to post op precautions   Hip extension    Hip abduction 4   Hip adduction 4   Hip internal rotation    Hip external rotation    Knee flexion 5   Knee extension 5   Ankle dorsiflexion    Ankle plantarflexion    Ankle inversion    Ankle eversion     (Blank rows = not tested)   FUNCTIONAL TESTS:  Eval 5 times sit to stand: 15.15 seconds without UE support, less weight shift to R LE  TREATMENT DATE:  03/23/24 Nu step Seat #11 X 10 min UE/LE Standing marches with one UE support X 15 reps bilat Standing abduction with one UE support X 15 reps bilat Standing hamstring curls with one UE support X 10 reps bilat Single leg stance 10 sec X 3 bilat Seated knee extensions 3# 2X15 Seated pball roll outs for hip flexion and lumbar stretch 5 sec X 10 Supine bent knee fall outs 10 sec X 5 Supine hip IR stretch bilat 10 sec X 5 Supine SKTC stretch 10 sec X 5 Right hip PROM gentle  to tolerance to ROM restrictions per protocol    PATIENT EDUCATION: Education details: HEP, PT plan of care, selfcare (discussed healing times, post op precautions, activity limitations) Person educated: Patient Education method: Explanation, Demonstration, Verbal cues, and Handouts Education comprehension: verbalized understanding, further education recommended   HOME EXERCISE PROGRAM: Access Code: [email or web address] URL: [email or web address] Date: 03/21/2024 Prepared by: Redell Moose  Exercises - Standing March with Counter Support  - 2 x daily - 6 x weekly - 1 sets - 10 reps - Standing Hip Abduction with Counter Support  - 2 x daily - 6 x weekly - 1 sets - 10 reps - Single Leg Stance  - 2 x daily - 6 x weekly - 1 sets - 3 reps - 10 sec hold - Sit to Stand  - 2 x daily - 6 x weekly - 1-2 sets - 5 reps - Hooklying Single Knee to Chest Stretch  - 2 x daily - 6 x weekly - 1 sets - 5 reps - 10 sec hold - Supine Bilateral Hip Internal Rotation Stretch  - 2 x daily - 6 x weekly - 1 sets - 5 reps - 10 sec hold - Bent Knee Fallouts  - 2 x daily - 6 x weekly - 1 sets - 5 reps - 10 sec hold  ASSESSMENT:  CLINICAL IMPRESSION: He is doing excellent up to this point post op. We reviewed HEP and post op precautions with good understanding and return demonstration. We will monitor for any soreness from this.   Eval: Patient referred to PT for post op labral repair 03/16/24. Overall he is doing very well up to this point and ambulates in without brace or assistive device and states he was told he did not have to use these post op. Patient will benefit from skilled PT to improve overall function and to address impairments and limitations listed below.  OBJECTIVE IMPAIRMENTS: decreased activity tolerance for ADL's, difficulty walking, decreased balance, decreased endurance, decreased mobility, decreased ROM, decreased strength, impaired flexibility, impaired LE use, and pain.  ACTIVITY  LIMITATIONS: bending, liftting, walking, standing, cleaning, community activity, driving, occupation  PERSONAL FACTORS: see above PMH  also affecting patient's functional outcome.  REHAB POTENTIAL: Excellent  CLINICAL DECISION MAKING: Stable/uncomplicated  EVALUATION COMPLEXITY: Low    GOALS: Short term PT Goals Target date: 04/18/2024   Pt will be I and compliant with HEP. Baseline:  Goal status: New Pt will decrease pain by 25% overall Baseline: Goal status: New  Long term PT goals Target date:05/16/2024   Pt will improve R hip AROM to [facility] to improve functional mobility Baseline: Goal status: New Pt will improve R LE strength to at least 4+/5 MMT to improve functional strength Baseline: Goal status: New Pt will improve Patient specific functional scale (PSFS) to at least 9/10 to show improved function level Baseline: Goal status: New Pt will reduce pain to overall less than 3/10 with  usual activity and work activity. Baseline: Goal status: New Pt will be able to ambulate community distances at least 500 ft WNL gait pattern without complaints Baseline: Goal status: New Pt will improve 5 times sit to stand to less than 12 seconds to show improved strength, balance.  Baseline: Goal status: New  PLAN: PT FREQUENCY: 1-3 times per week   PT DURATION: 6-8 weeks  PLANNED INTERVENTIONS  97110-Therapeutic exercises, 97530- Therapeutic activity, [id number]- Neuromuscular re-education, 97535- Self Care, [id number]- Manual therapy, and Patient/Family education  PLAN FOR NEXT SESSION: update HEP PRN, use Dr. Genelle protocol. Limit until 04/06/24: flexion to 90, abd to 30, avoid extension, IR to 30, ER to 20   Redell JONELLE Moose, PT,DPT 03/23/2024, 1:36 PM   "

## 2024-03-29 ENCOUNTER — Ambulatory Visit

## 2024-03-29 DIAGNOSIS — R262 Difficulty in walking, not elsewhere classified: Secondary | ICD-10-CM

## 2024-03-29 DIAGNOSIS — M25551 Pain in right hip: Secondary | ICD-10-CM

## 2024-03-29 DIAGNOSIS — M6281 Muscle weakness (generalized): Secondary | ICD-10-CM

## 2024-03-29 NOTE — Therapy (Signed)
 " OUTPATIENT PHYSICAL THERAPY TREATMENT   Patient Name: Samuel Campbell MRN: [id number] DOB:03-28-1975, 49 y.o., male Today's Date: 03/29/2024  END OF SESSION:  PT End of Session - 03/29/24 1515     Visit Number 3    Number of Visits 12    Date for Recertification  05/16/24    Authorization Type AETNA    PT Start Time 1515    PT Stop Time 1555    PT Time Calculation (min) 40 min    Activity Tolerance Patient tolerated treatment well    Behavior During Therapy [facility] for tasks assessed/performed          Past Medical History:  Diagnosis Date   Arthritis    Asthma    COVID 10/04/2019   sob, sinus issues, weaknessx 4 days all symptoms resolved   Elevated LFTs    Fatty liver 09/16/2020   GERD (gastroesophageal reflux disease)    Hepatic steatosis    High blood pressure 09/16/2020   Obesity    Plantar fascial fibromatosis of right foot 09/16/2020   wears insert   Pre-diabetes 09/16/2020   early dm per dr norleen general lov 09-16-2020   Sleep apnea    uses cpap does not know settings   Symptomatic cholelithiasis 09/16/2020   no recent symptoms per pt   Tendinitis    wears boot on left ankle when sleeping   Wears glasses 09/16/2020   Past Surgical History:  Procedure Laterality Date   ANKLE SURGERY Left 02/11/2018   CARPAL TUNNEL RELEASE Right    2006 or 2007 or 2008   CHOLECYSTECTOMY N/A 09/19/2020   Procedure: LAPAROSCOPIC CHOLECYSTECTOMY WITH INTRAOPERATIVE CHOLANGIOGRAM;  Surgeon: Sheldon Standing, MD;  Location: [facility];  Service: General;  Laterality: N/A;   FOOT SURGERY Left 02/11/2018   for plantar fascilits   LIVER BIOPSY N/A 09/19/2020   Procedure: NEEDLE CORE BIOPSY OF LIVER;  Surgeon: Sheldon Standing, MD;  Location: [facility];  Service: General;  Laterality: N/A;   TONSILLECTOMY     age 27   WISDOM TOOTH EXTRACTION     age 89   Patient Active Problem List   Diagnosis Date Noted   Allergic rhinitis due to animal (cat) (dog) hair  and dander 08/12/2020   Chronic allergic conjunctivitis 08/12/2020   Gastro-esophageal reflux disease without esophagitis 08/12/2020   Mild persistent asthma, uncomplicated 08/12/2020   Obesity (BMI 30-39.9) 08/12/2020   Chronic cholecystitis 08/12/2020   Prediabetes 08/12/2020    PCP: General Norleen, MD   REFERRING PROVIDER:   Genelle Standing, MD    REFERRING DIAG: [phone] (ICD-10-CM) - Pain in right hip   Rationale for Evaluation and Treatment:  Rehabiliation  THERAPY DIAG:  Pain in right hip  Difficulty in walking, not elsewhere classified  Muscle weakness (generalized)  ONSET DATE: post op labral repair 03/16/24   SUBJECTIVE:  SUBJECTIVE STATEMENT: Pt is 2 weeks s/p rates pain 2-3/10 entering clinic today. Pt report compliance with HEP. He is awaiting release to drive.  NEXT MD VISIT: 03/30/24  PERTINENT HISTORY:  See above PMH, post op labral repair 03/16/24  PAIN:  NPRS scale: 6/10 upon arrival Pain location:anterior-lateral right hip Pain description: intermittent achy,itchy Aggravating factors: sitting down, standing or walking too much Relieving factors: rest, meds   PRECAUTIONS: ,    RED FLAGS: None   WEIGHT BEARING RESTRICTIONS:  Yes WBAT on Rt LE  FALLS:  Has patient fallen in last 6 months? 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   PLOF:  Independent  PATIENT GOALS:  Return to work in 4 weeks if possible, no pain  OBJECTIVE:  Note: Objective measures were completed at Evaluation unless otherwise noted.  DIAGNOSTIC FINDINGS:    PATIENT SURVEYS:  Patient-Specific Activity Scoring Scheme  0 represents unable to perform. 10 represents able to perform at prior level. 0 1 2 3 4 5 6 7 8  9 10  (Date and Score)   Activity Eval     1. Standing 30 minutes  8    2. Driving for one hour 0    3. Sitting for church service 4   4.    5.    Score 4    Total score = sum of the activity scores/number of activities Minimum detectable change (90%CI) for average score = 2 points Minimum detectable change (90%CI) for single activity score = 3 points    GAIT: Eval Assistive device utilized: None Level of assistance: Complete Independence Comments: mild limp, trendelenburg on R LE    LOWER EXTREMITY ROM:     PROM  Right eval Left eval  Hip flexion 90   Hip extension    Hip abduction    Hip adduction    Hip internal rotation 10 22  Hip external rotation 20   Knee flexion    Knee extension Post-op: N/A   Ankle dorsiflexion    Ankle plantarflexion    Ankle inversion    Ankle eversion     (Blank rows = not tested)   LOWER EXTREMITY MMT:    MMT in sitting Right eval Left eval  Hip flexion Not tested due to post op precautions   Hip extension    Hip abduction 4   Hip adduction 4   Hip internal rotation    Hip external rotation    Knee flexion 5   Knee extension 5   Ankle dorsiflexion    Ankle plantarflexion    Ankle inversion    Ankle eversion     (Blank rows = not tested)   FUNCTIONAL TESTS:  Eval 5 times sit to stand: 15.15 seconds without UE support, less weight shift to R LE  TREATMENT DATE:   03/29/24 Nu step Seat #11 X 10 min UE/LE Standing marches with one UE support X 20 reps bilat Standing abduction with one UE support 2 X 15 reps bilat Standing hamstring curls with one UE support 2 X 15 reps bilat Single leg stance 10 sec X 3 bilat Seated knee extensions 3# 3X15 Seated pball roll outs for hip flexion and lumbar stretch 5 sec X 10 Supine bent knee fall outs 10 sec X 5 Supine hip IR stretch bilat 10 sec X  5 Supine SKTC stretch 10 sec X 5   03/23/24 Nu step Seat #11 X 10 min UE/LE Standing marches with one UE support X 15 reps bilat Standing abduction with one UE support X 15 reps bilat Standing hamstring curls with one UE support X 10 reps bilat Single leg stance 10 sec X 3 bilat Seated knee extensions 3# 2X15 Seated pball roll outs for hip flexion and lumbar stretch 5 sec X 10 Supine bent knee fall outs 10 sec X 5 Supine hip IR stretch bilat 10 sec X 5 Supine SKTC stretch 10 sec X 5 Right Hip PROM gentle to tolerance to ROM restrictions per protocol    PATIENT EDUCATION: Education details: HEP, PT plan of care, selfcare (discussed healing times, post op precautions, activity limitations) Person educated: Patient Education method: Explanation, Demonstration, Verbal cues, and Handouts Education comprehension: verbalized understanding, further education recommended   HOME EXERCISE PROGRAM: Access Code: [email or web address] URL: [email or web address] Date: 03/21/2024 Prepared by: Redell Moose  Exercises - Standing March with Counter Support  - 2 x daily - 6 x weekly - 1 sets - 10 reps - Standing Hip Abduction with Counter Support  - 2 x daily - 6 x weekly - 1 sets - 10 reps - Single Leg Stance  - 2 x daily - 6 x weekly - 1 sets - 3 reps - 10 sec hold - Sit to Stand  - 2 x daily - 6 x weekly - 1-2 sets - 5 reps - Hooklying Single Knee to Chest Stretch  - 2 x daily - 6 x weekly - 1 sets - 5 reps - 10 sec hold - Supine Bilateral Hip Internal Rotation Stretch  - 2 x daily - 6 x weekly - 1 sets - 5 reps - 10 sec hold - Bent Knee Fallouts  - 2 x daily - 6 x weekly - 1 sets - 5 reps - 10 sec hold  ASSESSMENT:  CLINICAL IMPRESSION: Pt tolerated session well without adverse reaction to tx. Session focused on functional strength and stretching.   Eval: Patient referred to PT for post op labral repair 03/16/24. Overall he is doing very well up to this point and ambulates in without  brace or assistive device and states he was told he did not have to use these post op. Patient will benefit from skilled PT to improve overall function and to address impairments and limitations listed below.  OBJECTIVE IMPAIRMENTS: decreased activity tolerance for ADL's, difficulty walking, decreased balance, decreased endurance, decreased mobility, decreased ROM, decreased strength, impaired flexibility, impaired LE use, and pain.  ACTIVITY LIMITATIONS: bending, liftting, walking, standing, cleaning, community activity, driving, occupation  PERSONAL FACTORS: see above PMH  also affecting patient's functional outcome.  REHAB POTENTIAL: Excellent  CLINICAL DECISION MAKING: Stable/uncomplicated  EVALUATION COMPLEXITY: Low    GOALS: Short term PT Goals Target date: 04/18/2024   Pt will be I and compliant with HEP. Baseline:  Goal status: New Pt will decrease pain  by 25% overall Baseline: Goal status: New  Long term PT goals Target date:05/16/2024   Pt will improve R hip AROM to [facility] to improve functional mobility Baseline: Goal status: New Pt will improve R LE strength to at least 4+/5 MMT to improve functional strength Baseline: Goal status: New Pt will improve Patient specific functional scale (PSFS) to at least 9/10 to show improved function level Baseline: Goal status: New Pt will reduce pain to overall less than 3/10 with usual activity and work activity. Baseline: Goal status: New Pt will be able to ambulate community distances at least 500 ft WNL gait pattern without complaints Baseline: Goal status: New Pt will improve 5 times sit to stand to less than 12 seconds to show improved strength, balance.  Baseline: Goal status: New  PLAN: PT FREQUENCY: 1-3 times per week   PT DURATION: 6-8 weeks  PLANNED INTERVENTIONS  97110-Therapeutic exercises, 97530- Therapeutic activity, [id number]- Neuromuscular re-education, 97535- Self Care, [id number]- Manual therapy, and  Patient/Family education  PLAN FOR NEXT SESSION: update HEP PRN, use Dr. Genelle protocol. Limit until 04/06/24: flexion to 90, abd to 30, avoid extension, IR to 30, ER to 20   Massie Ada, PT,DPT 03/29/2024, 3:59 PM   "

## 2024-03-30 ENCOUNTER — Ambulatory Visit (HOSPITAL_BASED_OUTPATIENT_CLINIC_OR_DEPARTMENT_OTHER): Admitting: Orthopaedic Surgery

## 2024-03-30 DIAGNOSIS — M25551 Pain in right hip: Secondary | ICD-10-CM

## 2024-03-30 NOTE — Progress Notes (Signed)
 "                                Post Operative Evaluation    Procedure/Date of Surgery: Right hip arthroscopy with labral repair 1/8  Interval History:   Presents 2-week status post above procedure.  Overall he is doing extremely well.  Denies any pain in the right hip occasional soreness.   PMH/PSH/Family History/Social History/Meds/Allergies:    Past Medical History:  Diagnosis Date   Arthritis    Asthma    COVID 10/04/2019   sob, sinus issues, weaknessx 4 days all symptoms resolved   Elevated LFTs    Fatty liver 09/16/2020   GERD (gastroesophageal reflux disease)    Hepatic steatosis    High blood pressure 09/16/2020   Obesity    Plantar fascial fibromatosis of right foot 09/16/2020   wears insert   Pre-diabetes 09/16/2020   early dm per dr norleen general lov 09-16-2020   Sleep apnea    uses cpap does not know settings   Symptomatic cholelithiasis 09/16/2020   no recent symptoms per pt   Tendinitis    wears boot on left ankle when sleeping   Wears glasses 09/16/2020   Past Surgical History:  Procedure Laterality Date   ANKLE SURGERY Left 02/11/2018   CARPAL TUNNEL RELEASE Right    2006 or 2007 or 2008   CHOLECYSTECTOMY N/A 09/19/2020   Procedure: LAPAROSCOPIC CHOLECYSTECTOMY WITH INTRAOPERATIVE CHOLANGIOGRAM;  Surgeon: Sheldon Standing, MD;  Location: [facility] [place];  Service: General;  Laterality: N/A;   FOOT SURGERY Left 02/11/2018   for plantar fascilits   LIVER BIOPSY N/A 09/19/2020   Procedure: NEEDLE CORE BIOPSY OF LIVER;  Surgeon: Sheldon Standing, MD;  Location: [facility] [place];  Service: General;  Laterality: N/A;   TONSILLECTOMY     age 49   WISDOM TOOTH EXTRACTION     age 49   Social History   Socioeconomic History   Marital status: Married    Spouse name: Not on file   Number of children: Not on file   Years of education: Not on file   Highest education level: Not on file  Occupational History   Not on file  Tobacco Use    Smoking status: Former    Current packs/day: 0.00    Average packs/day: 1.5 packs/day for 15.0 years (22.5 ttl pk-yrs)    Types: Cigarettes    Start date: 08/21/1986    Quit date: 08/20/2001    Years since quitting: 22.6   Smokeless tobacco: Former    Quit date: 08/20/2001  Vaping Use   Vaping status: Never Used  Substance and Sexual Activity   Alcohol use: Not Currently   Drug use: Never   Sexual activity: Not Currently  Other Topics Concern   Not on file  Social History Narrative   Not on file   Social Drivers of Health   Tobacco Use: Medium Risk (03/29/2024)   Patient History    Smoking Tobacco Use: Former    Smokeless Tobacco Use: Former    Passive Exposure: Not on Stage Manager: Not on [employer] Insecurity: Not on file  Transportation Needs: Not on file  Physical Activity: Not on file  Stress: Not on file  Social Connections: Not on file  Depression (PHQ2-9): Not on file  Alcohol Screen: Not on file  Housing: Not on file  Utilities: Not on file  Health  Literacy: Not on file   Family History  Problem Relation Age of Onset   Hypertension Mother    Celiac disease Mother    Colon polyps Mother    Diabetes Father    Alzheimer's disease Maternal Grandmother    COPD Maternal Grandfather    Asthma Maternal Grandfather    Diabetes Paternal Grandmother    Heart disease Paternal Grandmother    Diabetes Paternal Grandfather    Cirrhosis Other        Great uncle   Colon cancer Neg Hx    Stomach cancer Neg Hx    Esophageal cancer Neg Hx    Pancreatic cancer Neg Hx    Allergies[1] Current Outpatient Medications  Medication Sig Dispense Refill   aspirin  EC 325 MG tablet Take 1 tablet (325 mg total) by mouth daily. 14 tablet 0   oxyCODONE  (ROXICODONE ) 5 MG immediate release tablet Take 1 tablet (5 mg total) by mouth every 4 (four) hours as needed for severe pain (pain score 7-10) or breakthrough pain. 15 tablet 0   aspirin  81 MG chewable tablet Chew  81 mg by mouth daily.     budesonide-formoterol (SYMBICORT) 80-4.5 MCG/ACT inhaler Inhale 2 puffs into the lungs as needed.     Cholecalciferol (VITAMIN D3 PO) Take 1 tablet by mouth daily.     dexlansoprazole  (DEXILANT ) 60 MG capsule Take 1 capsule (60 mg total) by mouth daily. (Patient not taking: Reported on 09/29/2023) 90 capsule 3   dicyclomine  (BENTYL ) 20 MG tablet TAKE 1 TABLET BY MOUTH 4 TIMES DAILY BEFORE MEAL(S) AND AT BEDTIME (Patient not taking: Reported on 09/29/2023) 90 tablet 0   ELDERBERRY PO Take 1 tablet by mouth daily.     esomeprazole (NEXIUM) 20 MG packet Take 20 mg by mouth daily before breakfast.     fluticasone (FLONASE) 50 MCG/ACT nasal spray Place 1 spray into both nostrils 2 (two) times daily.     hydrochlorothiazide (HYDRODIURIL) 25 MG tablet Take 25 mg by mouth daily.     ipratropium (ATROVENT) 0.03 % nasal spray Place 1 spray into both nostrils as needed.  5   levocetirizine (XYZAL) 5 MG tablet Take 5 mg by mouth every evening.     losartan (COZAAR) 100 MG tablet Take 100 mg by mouth daily.     montelukast (SINGULAIR) 10 MG tablet Take 10 mg by mouth at bedtime.     Olopatadine HCl 0.2 % SOLN Place 1 drop into both eyes once a week. Pataday per pt on Monday and prn when takes contacts out     ondansetron  (ZOFRAN ) 4 MG tablet Take 1 tablet (4 mg total) by mouth every 8 (eight) hours as needed for nausea. (Patient not taking: Reported on 09/29/2023) 8 tablet 5   PROAIR HFA 108 (90 Base) MCG/ACT inhaler Inhale 2 puffs into the lungs as needed. Albuterol  2   traMADol  (ULTRAM ) 50 MG tablet Take 1-2 tablets (50-100 mg total) by mouth every 6 (six) hours as needed for moderate pain or severe pain. 20 tablet 0   vitamin C (ASCORBIC ACID) 500 MG tablet Take 1 tablet by mouth daily.     vitamin E 180 MG (400 UNITS) capsule Take 800 Units by mouth daily.     No current facility-administered medications for this visit.   No results found.  Review of Systems:   A ROS was  performed including pertinent positives and negatives as documented in the HPI.   Musculoskeletal Exam:    There were no  vitals taken for this visit.  Right incision well-appearing without erythema or drainage.  30 degrees internal/external rotation of the right hip are without pain.  Walks with nonantalgic gait  Imaging:      I personally reviewed and interpreted the radiographs.   Assessment:   2-week status post right hip arthroscopic labral repair doing extremely well.  This time we will continue to progress through strengthening and range of motion plan to see him back in 4 weeks for reassessment.  He will remain out of work at this time  Plan :    - 4 weeks for reassessment      I personally saw and evaluated the patient, and participated in the management and treatment plan.  Elspeth Parker, MD Attending Physician, Orthopedic Surgery  This document was dictated using Dragon voice recognition software. A reasonable attempt at proof reading has been made to minimize errors.    [1] No Known Allergies  "

## 2024-04-04 ENCOUNTER — Ambulatory Visit

## 2024-04-06 ENCOUNTER — Ambulatory Visit

## 2024-04-06 DIAGNOSIS — M6281 Muscle weakness (generalized): Secondary | ICD-10-CM

## 2024-04-06 DIAGNOSIS — M25551 Pain in right hip: Secondary | ICD-10-CM

## 2024-04-06 DIAGNOSIS — R262 Difficulty in walking, not elsewhere classified: Secondary | ICD-10-CM

## 2024-04-06 NOTE — Therapy (Signed)
 " OUTPATIENT PHYSICAL THERAPY TREATMENT   Patient Name: Samuel Campbell MRN: [id number] DOB:02-Jan-1976, 49 y.o., male Today's Date: 04/06/2024  END OF SESSION:  PT End of Session - 04/06/24 1303     Visit Number 4    Number of Visits 12    Date for Recertification  05/16/24    Authorization Type AETNA    PT Start Time 1302    PT Stop Time 1345    PT Time Calculation (min) 43 min    Activity Tolerance Patient tolerated treatment well    Behavior During Therapy [facility] for tasks assessed/performed          Past Medical History:  Diagnosis Date   Arthritis    Asthma    COVID 10/04/2019   sob, sinus issues, weaknessx 4 days all symptoms resolved   Elevated LFTs    Fatty liver 09/16/2020   GERD (gastroesophageal reflux disease)    Hepatic steatosis    High blood pressure 09/16/2020   Obesity    Plantar fascial fibromatosis of right foot 09/16/2020   wears insert   Pre-diabetes 09/16/2020   early dm per dr norleen general lov 09-16-2020   Sleep apnea    uses cpap does not know settings   Symptomatic cholelithiasis 09/16/2020   no recent symptoms per pt   Tendinitis    wears boot on left ankle when sleeping   Wears glasses 09/16/2020   Past Surgical History:  Procedure Laterality Date   ANKLE SURGERY Left 02/11/2018   CARPAL TUNNEL RELEASE Right    2006 or 2007 or 2008   CHOLECYSTECTOMY N/A 09/19/2020   Procedure: LAPAROSCOPIC CHOLECYSTECTOMY WITH INTRAOPERATIVE CHOLANGIOGRAM;  Surgeon: Sheldon Standing, MD;  Location: [facility] [place];  Service: General;  Laterality: N/A;   FOOT SURGERY Left 02/11/2018   for plantar fascilits   LIVER BIOPSY N/A 09/19/2020   Procedure: NEEDLE CORE BIOPSY OF LIVER;  Surgeon: Sheldon Standing, MD;  Location: [facility] [place];  Service: General;  Laterality: N/A;   TONSILLECTOMY     age 40   WISDOM TOOTH EXTRACTION     age 13   Patient Active Problem List   Diagnosis Date Noted   Allergic rhinitis due to animal (cat) (dog) hair  and dander 08/12/2020   Chronic allergic conjunctivitis 08/12/2020   Gastro-esophageal reflux disease without esophagitis 08/12/2020   Mild persistent asthma, uncomplicated 08/12/2020   Obesity (BMI 30-39.9) 08/12/2020   Chronic cholecystitis 08/12/2020   Prediabetes 08/12/2020    PCP: General Norleen, MD   REFERRING PROVIDER:   Genelle Standing, MD    REFERRING DIAG: [phone] (ICD-10-CM) - Pain in right hip   Rationale for Evaluation and Treatment:  Rehabiliation  THERAPY DIAG:  Pain in right hip  Difficulty in walking, not elsewhere classified  Muscle weakness (generalized)  ONSET DATE: post op labral repair 03/16/24   SUBJECTIVE:  SUBJECTIVE STATEMENT: Pt is 3 weeks s/p rates pain 0/10 entering clinic today, only stiffness. Pt report compliance with HEP. He was released by MD to progress with protocol with pain being a guiding factor.  NEXT MD VISIT: 05/03/24  PERTINENT HISTORY:  See above PMH, post op labral repair 03/16/24  PAIN:  NPRS scale: 6/10 upon arrival Pain location:anterior-lateral right hip Pain description: intermittent achy,itchy Aggravating factors: sitting down, standing or walking too much Relieving factors: rest, meds   PRECAUTIONS: ,    RED FLAGS: None   WEIGHT BEARING RESTRICTIONS:  Yes WBAT on Rt LE  FALLS:  Has patient fallen in last 6 months? 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   PLOF:  Independent  PATIENT GOALS:  Return to work in 4 weeks if possible, no pain  OBJECTIVE:  Note: Objective measures were completed at Evaluation unless otherwise noted.  DIAGNOSTIC FINDINGS:    PATIENT SURVEYS:  Patient-Specific Activity Scoring Scheme  0 represents unable to perform. 10  represents able to perform at prior level. 0 1 2 3 4 5 6 7 8 9  10 (Date and Score)   Activity Eval     1. Standing 30 minutes  8    2. Driving for one hour 0    3. Sitting for church service 4   4.    5.    Score 4    Total score = sum of the activity scores/number of activities Minimum detectable change (90%CI) for average score = 2 points Minimum detectable change (90%CI) for single activity score = 3 points    GAIT: Eval Assistive device utilized: None Level of assistance: Complete Independence Comments: mild limp, trendelenburg on R LE    LOWER EXTREMITY ROM:     PROM  Right eval Left eval  Hip flexion 90   Hip extension    Hip abduction    Hip adduction    Hip internal rotation 10 22  Hip external rotation 20   Knee flexion    Knee extension Post-op: N/A   Ankle dorsiflexion    Ankle plantarflexion    Ankle inversion    Ankle eversion     (Blank rows = not tested)   LOWER EXTREMITY MMT:    MMT in sitting Right eval Left eval  Hip flexion Not tested due to post op precautions   Hip extension    Hip abduction 4   Hip adduction 4   Hip internal rotation    Hip external rotation    Knee flexion 5   Knee extension 5   Ankle dorsiflexion    Ankle plantarflexion    Ankle inversion    Ankle eversion     (Blank rows = not tested)   FUNCTIONAL TESTS:  Eval 5 times sit to stand: 15.15 seconds without UE support, less weight shift to R LE  TREATMENT DATE:   04/06/24 Nu step Seat #11 X 8 min UE/LE Mini squats 3 x 10 reps Lateral band walk 3 x 10 reps yellow (band below knees) Standing marches on airex pad with one UE support X 20 reps bilat Single leg stance 30 sec X 2 bilat Forward/Lateral step up 6 2 x 10 RLE Seated knee extension #50 3 x 10 reps Seated leg curl 40# 3 x 10  Seated SLR 3 x 6 reps (no pain) Standing  hip extension 3 x 10 reps Glute bridges 2 x 10 reps  Supine bent knee fall outs 10 sec X 5 Supine hip IR stretch bilat 10 sec X 5 Supine SKTC stretch 10 sec X 5    03/29/24 Nu step Seat #11 X 10 min UE/LE Standing marches with one UE support X 20 reps bilat Standing abduction with one UE support 2 X 15 reps bilat Standing hamstring curls with one UE support 2 X 15 reps bilat Single leg stance 10 sec X 3 bilat Seated knee extensions 3# 3X15 Seated pball roll outs for hip flexion and lumbar stretch 5 sec X 10 Supine bent knee fall outs 10 sec X 5 Supine hip IR stretch bilat 10 sec X 5 Supine SKTC stretch 10 sec X 5   03/23/24 Nu step Seat #11 X 10 min UE/LE Standing marches with one UE support X 15 reps bilat Standing abduction with one UE support X 15 reps bilat Standing hamstring curls with one UE support X 10 reps bilat Single leg stance 10 sec X 3 bilat Seated knee extensions 3# 2X15 Seated pball roll outs for hip flexion and lumbar stretch 5 sec X 10 Supine bent knee fall outs 10 sec X 5 Supine hip IR stretch bilat 10 sec X 5 Supine SKTC stretch 10 sec X 5 Right Hip PROM gentle to tolerance to ROM restrictions per protocol    PATIENT EDUCATION: Education details: HEP, PT plan of care, selfcare (discussed healing times, post op precautions, activity limitations) Person educated: Patient Education method: Explanation, Demonstration, Verbal cues, and Handouts Education comprehension: verbalized understanding, further education recommended   HOME EXERCISE PROGRAM: Access Code: [email or web address] URL: [email or web address] Date: 03/21/2024 Prepared by: Redell Moose  Exercises - Standing March with Counter Support  - 2 x daily - 6 x weekly - 1 sets - 10 reps - Standing Hip Abduction with Counter Support  - 2 x daily - 6 x weekly - 1 sets - 10 reps - Single Leg Stance  - 2 x daily - 6 x weekly - 1 sets - 3 reps - 10 sec hold - Sit to Stand  - 2 x daily - 6 x weekly  - 1-2 sets - 5 reps - Hooklying Single Knee to Chest Stretch  - 2 x daily - 6 x weekly - 1 sets - 5 reps - 10 sec hold - Supine Bilateral Hip Internal Rotation Stretch  - 2 x daily - 6 x weekly - 1 sets - 5 reps - 10 sec hold - Bent Knee Fallouts  - 2 x daily - 6 x weekly - 1 sets - 5 reps - 10 sec hold  ASSESSMENT:  CLINICAL IMPRESSION: Pt is 3 weeks s/p. Pt tolerated advancements in protocol well without adverse reaction to tx. Session focused on functional strength and stretching.   Eval: Patient referred to PT for post op labral repair 03/16/24. Overall he is doing very well up to this point and ambulates in without brace or  assistive device and states he was told he did not have to use these post op. Patient will benefit from skilled PT to improve overall function and to address impairments and limitations listed below.  OBJECTIVE IMPAIRMENTS: decreased activity tolerance for ADL's, difficulty walking, decreased balance, decreased endurance, decreased mobility, decreased ROM, decreased strength, impaired flexibility, impaired LE use, and pain.  ACTIVITY LIMITATIONS: bending, liftting, walking, standing, cleaning, community activity, driving, occupation  PERSONAL FACTORS: see above PMH  also affecting patient's functional outcome.  REHAB POTENTIAL: Excellent  CLINICAL DECISION MAKING: Stable/uncomplicated  EVALUATION COMPLEXITY: Low    GOALS: Short term PT Goals Target date: 04/18/2024   Pt will be I and compliant with HEP. Baseline:  Goal status: New Pt will decrease pain by 25% overall Baseline: Goal status: New  Long term PT goals Target date:05/16/2024   Pt will improve R hip AROM to [facility] to improve functional mobility Baseline: Goal status: New Pt will improve R LE strength to at least 4+/5 MMT to improve functional strength Baseline: Goal status: New Pt will improve Patient specific functional scale (PSFS) to at least 9/10 to show improved function  level Baseline: Goal status: New Pt will reduce pain to overall less than 3/10 with usual activity and work activity. Baseline: Goal status: New Pt will be able to ambulate community distances at least 500 ft WNL gait pattern without complaints Baseline: Goal status: New Pt will improve 5 times sit to stand to less than 12 seconds to show improved strength, balance.  Baseline: Goal status: New  PLAN: PT FREQUENCY: 1-3 times per week   PT DURATION: 6-8 weeks  PLANNED INTERVENTIONS  [id number]-Therapeutic exercises, 97530- Therapeutic activity, [id number]- Neuromuscular re-education, 97535- Self Care, [id number]- Manual therapy, and Patient/Family education  PLAN FOR NEXT SESSION: update HEP next visit 04/10/24, use Dr. Genelle protocol. Limit until 04/06/24: flexion to 90, abd to 30, avoid extension, IR to 30, ER to 20   Massie Ada, PT,DPT 04/06/2024, 1:47 PM   "

## 2024-04-10 ENCOUNTER — Ambulatory Visit: Admitting: Physical Therapy

## 2024-04-12 ENCOUNTER — Ambulatory Visit

## 2024-04-12 DIAGNOSIS — R262 Difficulty in walking, not elsewhere classified: Secondary | ICD-10-CM

## 2024-04-12 DIAGNOSIS — M25551 Pain in right hip: Secondary | ICD-10-CM

## 2024-04-12 DIAGNOSIS — M6281 Muscle weakness (generalized): Secondary | ICD-10-CM

## 2024-04-12 NOTE — Therapy (Signed)
 " OUTPATIENT PHYSICAL THERAPY TREATMENT   Patient Name: Samuel Campbell MRN: [id number] DOB:1975/12/10, 49 y.o., male Today's Date: 04/12/2024  END OF SESSION:  PT End of Session - 04/12/24 1106     Visit Number 5    Number of Visits 12    Date for Recertification  05/16/24    Authorization Type AET[employer]    PT Start Time 1103    PT Stop Time 1143    PT Time Calculation (min) 40 min    Activity Tolerance Patient tolerated treatment well    Behavior During Therapy [facility] for tasks assessed/performed          Past Medical History:  Diagnosis Date   Arthritis    Asthma    COVID 10/04/2019   sob, sinus issues, weaknessx 4 days all symptoms resolved   Elevated LFTs    Fatty liver 09/16/2020   GERD (gastroesophageal reflux disease)    Hepatic steatosis    High blood pressure 09/16/2020   Obesity    Plantar fascial fibromatosis of right foot 09/16/2020   wears insert   Pre-diabetes 09/16/2020   early dm per dr norleen general lov 09-16-2020   Sleep apnea    uses cpap does not know settings   Symptomatic cholelithiasis 09/16/2020   no recent symptoms per pt   Tendinitis    wears boot on left ankle when sleeping   Wears glasses 09/16/2020   Past Surgical History:  Procedure Laterality Date   ANKLE SURGERY Left 02/11/2018   CARPAL TUNNEL RELEASE Right    2006 or 2007 or 2008   CHOLECYSTECTOMY N/A 09/19/2020   Procedure: LAPAROSCOPIC CHOLECYSTECTOMY WITH INTRAOPERATIVE CHOLANGIOGRAM;  Surgeon: Sheldon Standing, MD;  Location: [facility] [place];  Service: General;  Laterality: N/A;   FOOT SURGERY Left 02/11/2018   for plantar fascilits   LIVER BIOPSY N/A 09/19/2020   Procedure: NEEDLE CORE BIOPSY OF LIVER;  Surgeon: Sheldon Standing, MD;  Location: [facility];  Service: General;  Laterality: N/A;   TONSILLECTOMY     age 85   WISDOM TOOTH EXTRACTION     age 43   Patient Active Problem List   Diagnosis Date Noted   Allergic rhinitis due to animal (cat) (dog) hair  and dander 08/12/2020   Chronic allergic conjunctivitis 08/12/2020   Gastro-esophageal reflux disease without esophagitis 08/12/2020   Mild persistent asthma, uncomplicated 08/12/2020   Obesity (BMI 30-39.9) 08/12/2020   Chronic cholecystitis 08/12/2020   Prediabetes 08/12/2020    PCP: General Norleen, MD   REFERRING PROVIDER:   Genelle Standing, MD    REFERRING DIAG: [phone] (ICD-10-CM) - Pain in right hip   Rationale for Evaluation and Treatment:  Rehabiliation  THERAPY DIAG:  Pain in right hip  Difficulty in walking, not elsewhere classified  Muscle weakness (generalized)  ONSET DATE: post op labral repair 03/16/24   SUBJECTIVE:  SUBJECTIVE STATEMENT: Pt is 3 weeks 6 days s/p rates pain 0/10 entering clinic today, some muscle soreness. Pt report compliance with HEP. Pt reports some fatgue and soreness lasting 24-48 after last session.  NEXT MD VISIT: 05/03/24  PERTINENT HISTORY:  See above PMH, post op labral repair 03/16/24  PAIN:  NPRS scale: 6/10 upon arrival Pain location:anterior-lateral right hip Pain description: intermittent achy,itchy Aggravating factors: sitting down, standing or walking too much Relieving factors: rest, meds   PRECAUTIONS: ,    RED FLAGS: None   WEIGHT BEARING RESTRICTIONS:  Yes WBAT on Rt LE  FALLS:  Has patient fallen in last 6 months? 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   PLOF:  Independent  PATIENT GOALS:  Return to work in 4 weeks if possible, no pain  OBJECTIVE:  Note: Objective measures were completed at Evaluation unless otherwise noted.  DIAGNOSTIC FINDINGS:    PATIENT SURVEYS:  Patient-Specific Activity Scoring Scheme  0 represents unable to perform.  10 represents able to perform at prior level. 0 1 2 3 4 5 6 7 8 9  10 (Date and Score)   Activity Eval     1. Standing 30 minutes  8    2. Driving for one hour 0    3. Sitting for church service 4   4.    5.    Score 4    Total score = sum of the activity scores/number of activities Minimum detectable change (90%CI) for average score = 2 points Minimum detectable change (90%CI) for single activity score = 3 points    GAIT: Eval Assistive device utilized: None Level of assistance: Complete Independence Comments: mild limp, trendelenburg on R LE    LOWER EXTREMITY ROM:     PROM  Right eval Left eval  Hip flexion 90   Hip extension    Hip abduction    Hip adduction    Hip internal rotation 10 22  Hip external rotation 20   Knee flexion    Knee extension Post-op: N/A   Ankle dorsiflexion    Ankle plantarflexion    Ankle inversion    Ankle eversion     (Blank rows = not tested)   LOWER EXTREMITY MMT:    MMT in sitting Right eval Left eval  Hip flexion Not tested due to post op precautions   Hip extension    Hip abduction 4   Hip adduction 4   Hip internal rotation    Hip external rotation    Knee flexion 5   Knee extension 5   Ankle dorsiflexion    Ankle plantarflexion    Ankle inversion    Ankle eversion     (Blank rows = not tested)   FUNCTIONAL TESTS:  Eval 5 times sit to stand: 15.15 seconds without UE support, less weight shift to R LE  TREATMENT DATE:    04/12/24 Recumbent bike L1 x Squats to chair with airex 2 x 10  Lateral band walk 3 x 10 reps red (band below knees) Seated knee extension #50 3 x 10 reps Seated leg curl 40# 3 x 10  Forward/Lateral step up 6 2 x 10 RLE Standing marches on airex pad with one UE support X 20 reps bilat Single leg stance 30 sec X 2 bilat airex Standing hip extension 2 x 10  reps BLE Seated SLR 3 x 6 reps (no pain) Supine bent knee fall outs 10 sec X 5 Supine hip IR stretch bilat 10 sec X 5 Supine SKTC stretch 10 sec X 5  04/06/24 Nu step Seat #11 X 8 min UE/LE Mini squats 3 x 10 reps Lateral band walk 3 x 10 reps yellow (band below knees) Standing marches on airex pad with one UE support X 20 reps bilat Single leg stance 30 sec X 2 bilat Forward/Lateral step up 6 2 x 10 RLE Seated knee extension #50 3 x 10 reps Seated leg curl 40# 3 x 10  Seated SLR 3 x 6 reps (no pain) Standing hip extension 3 x 10 reps Glute bridges 2 x 10 reps  Supine bent knee fall outs 10 sec X 5 Supine hip IR stretch bilat 10 sec X 5 Supine SKTC stretch 10 sec X 5    03/29/24 Nu step Seat #11 X 10 min UE/LE Standing marches with one UE support X 20 reps bilat Standing abduction with one UE support 2 X 15 reps bilat Standing hamstring curls with one UE support 2 X 15 reps bilat Single leg stance 10 sec X 3 bilat Seated knee extensions 3# 3X15 Seated pball roll outs for hip flexion and lumbar stretch 5 sec X 10 Supine bent knee fall outs 10 sec X 5 Supine hip IR stretch bilat 10 sec X 5 Supine SKTC stretch 10 sec X 5       PATIENT EDUCATION: Education details: HEP, PT plan of care, selfcare (discussed healing times, post op precautions, activity limitations) Person educated: Patient Education method: Explanation, Demonstration, Verbal cues, and Handouts Education comprehension: verbalized understanding, further education recommended   HOME EXERCISE PROGRAM: Access Code: [email or web address] URL: [email or web address] Date: 04/12/2024 Prepared by: Massie Ada  Exercises - Single Leg Stance  - 2 x daily - 6 x weekly - 1 sets - 3 reps - 10 sec hold - Hooklying Single Knee to Chest Stretch  - 2 x daily - 6 x weekly - 1 sets - 5 reps - 10 sec hold - Supine Bilateral Hip Internal Rotation Stretch  - 2 x daily - 6 x weekly - 1 sets - 5 reps - 10 sec hold - Bent  Knee Fallouts  - 2 x daily - 6 x weekly - 1 sets - 5 reps - 10 sec hold - Side Stepping with Resistance at Feet  - 1 x daily - 7 x weekly - 3 sets - 10 reps - Squat  - 1 x daily - 7 x weekly - 3 sets - 10 reps - Single Leg Knee Extension with Weight Machine  - 1 x daily - 7 x weekly - 3 sets - 10 reps - Single Leg Hamstring Curl with Weight Machine  - 1 x daily - 7 x weekly - 3 sets - 10 reps  ASSESSMENT:  CLINICAL IMPRESSION: Pt is 3 weeks 6 days s/p. Pt tolerated PRE's well without adverse reaction to tx.  Updated patient HEP in order for patient to return 1x/2 weeks.  Eval: Patient referred to PT for post op labral repair 03/16/24. Overall he is doing very well up to this point and ambulates in without brace or assistive device and states he was told he did not have to use these post op. Patient will benefit from skilled PT to improve overall function and to address impairments and limitations listed below.  OBJECTIVE IMPAIRMENTS: decreased activity tolerance for ADL's, difficulty walking, decreased balance, decreased endurance, decreased mobility, decreased ROM, decreased strength, impaired flexibility, impaired LE use, and pain.  ACTIVITY LIMITATIONS: bending, liftting, walking, standing, cleaning, community activity, driving, occupation  PERSONAL FACTORS: see above PMH  also affecting patient's functional outcome.  REHAB POTENTIAL: Excellent  CLINICAL DECISION MAKING: Stable/uncomplicated  EVALUATION COMPLEXITY: Low    GOALS: Short term PT Goals Target date: 04/18/2024   Pt will be I and compliant with HEP. Baseline:  Goal status: New Pt will decrease pain by 25% overall Baseline: Goal status: New  Long term PT goals Target date:05/16/2024   Pt will improve R hip AROM to [facility] to improve functional mobility Baseline: Goal status: New Pt will improve R LE strength to at least 4+/5 MMT to improve functional strength Baseline: Goal status: New Pt will improve Patient  specific functional scale (PSFS) to at least 9/10 to show improved function level Baseline: Goal status: New Pt will reduce pain to overall less than 3/10 with usual activity and work activity. Baseline: Goal status: New Pt will be able to ambulate community distances at least 500 ft WNL gait pattern without complaints Baseline: Goal status: New Pt will improve 5 times sit to stand to less than 12 seconds to show improved strength, balance.  Baseline: Goal status: New  PLAN: PT FREQUENCY: 1-3 times per week   PT DURATION: 6-8 weeks  PLANNED INTERVENTIONS  [id number]-Therapeutic exercises, 97530- Therapeutic activity, [id number]- Neuromuscular re-education, 97535- Self Care, [id number]- Manual therapy, and Patient/Family education  PLAN FOR NEXT SESSION: update HEP next visit 04/10/24, use Dr. Genelle protocol. Limit until 04/06/24: flexion to 90, abd to 30, avoid extension, IR to 30, ER to 20   Massie Ada, PT,DPT 04/12/2024, 11:43 AM   "

## 2024-04-17 ENCOUNTER — Ambulatory Visit

## 2024-04-19 ENCOUNTER — Ambulatory Visit

## 2024-04-24 ENCOUNTER — Ambulatory Visit: Admitting: Physical Therapy

## 2024-04-26 ENCOUNTER — Ambulatory Visit

## 2024-05-03 ENCOUNTER — Encounter (HOSPITAL_BASED_OUTPATIENT_CLINIC_OR_DEPARTMENT_OTHER): Admitting: Orthopaedic Surgery

## 2024-05-09 ENCOUNTER — Ambulatory Visit: Admitting: Physical Therapy

## 2024-05-23 ENCOUNTER — Ambulatory Visit
